# Patient Record
Sex: Male | Born: 1945 | Race: Black or African American | Hispanic: No | Marital: Married
Health system: Southern US, Community
[De-identification: ages and names within clinical notes are randomized; demographics above are authoritative.]

## PROBLEM LIST (undated history)

## (undated) DIAGNOSIS — J9621 Acute and chronic respiratory failure with hypoxia: Secondary | ICD-10-CM

## (undated) DIAGNOSIS — U071 COVID-19: Secondary | ICD-10-CM

## (undated) DIAGNOSIS — I482 Chronic atrial fibrillation, unspecified: Secondary | ICD-10-CM

## (undated) DIAGNOSIS — N183 Chronic kidney disease, stage 3 unspecified: Secondary | ICD-10-CM

## (undated) DIAGNOSIS — J189 Pneumonia, unspecified organism: Secondary | ICD-10-CM

---

## 2020-03-02 ENCOUNTER — Other Ambulatory Visit (HOSPITAL_COMMUNITY): Payer: Medicare Other

## 2020-03-02 ENCOUNTER — Inpatient Hospital Stay
Admission: RE | Admit: 2020-03-02 | Discharge: 2020-05-03 | Disposition: A | Discharge status: Skilled Nursing Facility | Payer: Medicare Other | Source: Other Acute Inpatient Hospital | Attending: Internal Medicine | Admitting: Internal Medicine

## 2020-03-02 ENCOUNTER — Ambulatory Visit (HOSPITAL_COMMUNITY)
Admission: AD | Admit: 2020-03-02 | Discharge: 2020-03-02 | Disposition: A | Discharge status: Home / Self Care | Payer: Medicare Other | Source: Other Acute Inpatient Hospital | Attending: Internal Medicine | Admitting: Internal Medicine

## 2020-03-02 DIAGNOSIS — Z9911 Dependence on respirator [ventilator] status: Secondary | ICD-10-CM | POA: Diagnosis present

## 2020-03-02 DIAGNOSIS — I509 Heart failure, unspecified: Secondary | ICD-10-CM

## 2020-03-02 DIAGNOSIS — T148XXA Other injury of unspecified body region, initial encounter: Secondary | ICD-10-CM

## 2020-03-02 DIAGNOSIS — Z931 Gastrostomy status: Secondary | ICD-10-CM

## 2020-03-02 DIAGNOSIS — Z4659 Encounter for fitting and adjustment of other gastrointestinal appliance and device: Secondary | ICD-10-CM

## 2020-03-02 DIAGNOSIS — I482 Chronic atrial fibrillation, unspecified: Secondary | ICD-10-CM | POA: Diagnosis present

## 2020-03-02 DIAGNOSIS — R0902 Hypoxemia: Secondary | ICD-10-CM

## 2020-03-02 DIAGNOSIS — J811 Chronic pulmonary edema: Secondary | ICD-10-CM

## 2020-03-02 DIAGNOSIS — R52 Pain, unspecified: Secondary | ICD-10-CM

## 2020-03-02 DIAGNOSIS — Z452 Encounter for adjustment and management of vascular access device: Secondary | ICD-10-CM

## 2020-03-02 DIAGNOSIS — U071 COVID-19: Secondary | ICD-10-CM

## 2020-03-02 DIAGNOSIS — J189 Pneumonia, unspecified organism: Secondary | ICD-10-CM

## 2020-03-02 DIAGNOSIS — N183 Chronic kidney disease, stage 3 unspecified: Secondary | ICD-10-CM | POA: Diagnosis present

## 2020-03-02 DIAGNOSIS — Z95828 Presence of other vascular implants and grafts: Secondary | ICD-10-CM

## 2020-03-02 DIAGNOSIS — J9621 Acute and chronic respiratory failure with hypoxia: Secondary | ICD-10-CM | POA: Diagnosis present

## 2020-03-02 DIAGNOSIS — R0603 Acute respiratory distress: Secondary | ICD-10-CM

## 2020-03-02 DIAGNOSIS — J9690 Respiratory failure, unspecified, unspecified whether with hypoxia or hypercapnia: Secondary | ICD-10-CM

## 2020-03-02 DIAGNOSIS — N179 Acute kidney failure, unspecified: Secondary | ICD-10-CM

## 2020-03-02 HISTORY — DX: Pneumonia, unspecified organism: J18.9

## 2020-03-02 HISTORY — DX: COVID-19: U07.1

## 2020-03-02 HISTORY — DX: Chronic kidney disease, stage 3 unspecified: N18.30

## 2020-03-02 HISTORY — DX: Chronic atrial fibrillation, unspecified: I48.20

## 2020-03-02 HISTORY — DX: Acute and chronic respiratory failure with hypoxia: J96.21

## 2020-03-02 LAB — BLOOD GAS, ARTERIAL
Acid-Base Excess: 5.4 mmol/L — ABNORMAL HIGH (ref 0.0–2.0)
Bicarbonate: 30.8 mmol/L — ABNORMAL HIGH (ref 20.0–28.0)
FIO2: 50
O2 Saturation: 98.5 %
Patient temperature: 37
pCO2 arterial: 58.3 mmHg — ABNORMAL HIGH (ref 32.0–48.0)
pH, Arterial: 7.343 — ABNORMAL LOW (ref 7.350–7.450)
pO2, Arterial: 122 mmHg — ABNORMAL HIGH (ref 83.0–108.0)

## 2020-03-02 MED ORDER — IOHEXOL 300 MG/ML  SOLN: 50.0000 mL | Freq: Once | INTRAMUSCULAR | Status: DC | PRN

## 2020-03-03 ENCOUNTER — Encounter: Payer: Self-pay | Admitting: Internal Medicine

## 2020-03-03 DIAGNOSIS — N183 Chronic kidney disease, stage 3 unspecified: Secondary | ICD-10-CM | POA: Diagnosis not present

## 2020-03-03 DIAGNOSIS — I482 Chronic atrial fibrillation, unspecified: Secondary | ICD-10-CM | POA: Diagnosis not present

## 2020-03-03 DIAGNOSIS — U071 COVID-19: Secondary | ICD-10-CM | POA: Diagnosis present

## 2020-03-03 DIAGNOSIS — J189 Pneumonia, unspecified organism: Secondary | ICD-10-CM | POA: Diagnosis present

## 2020-03-03 DIAGNOSIS — J9621 Acute and chronic respiratory failure with hypoxia: Secondary | ICD-10-CM

## 2020-03-03 LAB — CBC WITH DIFFERENTIAL/PLATELET
Abs Immature Granulocytes: 0.04 10*3/uL (ref 0.00–0.07)
Basophils Absolute: 0 10*3/uL (ref 0.0–0.1)
Basophils Relative: 0 %
Eosinophils Absolute: 0.1 10*3/uL (ref 0.0–0.5)
Eosinophils Relative: 2 %
HCT: 30.7 % — ABNORMAL LOW (ref 39.0–52.0)
Hemoglobin: 9 g/dL — ABNORMAL LOW (ref 13.0–17.0)
Immature Granulocytes: 1 %
Lymphocytes Relative: 21 %
Lymphs Abs: 1.5 10*3/uL (ref 0.7–4.0)
MCH: 29.4 pg (ref 26.0–34.0)
MCHC: 29.3 g/dL — ABNORMAL LOW (ref 30.0–36.0)
MCV: 100.3 fL — ABNORMAL HIGH (ref 80.0–100.0)
Monocytes Absolute: 0.6 10*3/uL (ref 0.1–1.0)
Monocytes Relative: 8 %
Neutro Abs: 5 10*3/uL (ref 1.7–7.7)
Neutrophils Relative %: 68 %
Platelets: 364 10*3/uL (ref 150–400)
RBC: 3.06 MIL/uL — ABNORMAL LOW (ref 4.22–5.81)
RDW: 17.4 % — ABNORMAL HIGH (ref 11.5–15.5)
WBC: 7.2 10*3/uL (ref 4.0–10.5)
nRBC: 0.4 % — ABNORMAL HIGH (ref 0.0–0.2)

## 2020-03-03 LAB — COMPREHENSIVE METABOLIC PANEL
ALT: 66 U/L — ABNORMAL HIGH (ref 0–44)
AST: 37 U/L (ref 15–41)
Albumin: 2.5 g/dL — ABNORMAL LOW (ref 3.5–5.0)
Alkaline Phosphatase: 83 U/L (ref 38–126)
Anion gap: 12 (ref 5–15)
BUN: 57 mg/dL — ABNORMAL HIGH (ref 8–23)
CO2: 29 mmol/L (ref 22–32)
Calcium: 9.6 mg/dL (ref 8.9–10.3)
Chloride: 101 mmol/L (ref 98–111)
Creatinine, Ser: 1.67 mg/dL — ABNORMAL HIGH (ref 0.61–1.24)
GFR calc Af Amer: 46 mL/min — ABNORMAL LOW (ref >60)
GFR calc non Af Amer: 40 mL/min — ABNORMAL LOW (ref >60)
Glucose, Bld: 113 mg/dL — ABNORMAL HIGH (ref 70–99)
Potassium: 4.1 mmol/L (ref 3.5–5.1)
Sodium: 142 mmol/L (ref 135–145)
Total Bilirubin: 0.8 mg/dL (ref 0.3–1.2)
Total Protein: 7.8 g/dL (ref 6.5–8.1)

## 2020-03-03 LAB — PROTIME-INR
INR: 1.5 — ABNORMAL HIGH (ref 0.8–1.2)
Prothrombin Time: 17.6 seconds — ABNORMAL HIGH (ref 11.4–15.2)

## 2020-03-03 LAB — MAGNESIUM: Magnesium: 2.4 mg/dL (ref 1.7–2.4)

## 2020-03-03 LAB — APTT: aPTT: 42 seconds — ABNORMAL HIGH (ref 24–36)

## 2020-03-03 LAB — PHOSPHORUS: Phosphorus: 5.3 mg/dL — ABNORMAL HIGH (ref 2.5–4.6)

## 2020-03-03 MED ORDER — QUETIAPINE FUMARATE 25 MG PO TABS
100.00 | ORAL_TABLET | ORAL | Status: DC
Start: 2020-03-02 — End: 2020-03-03

## 2020-03-03 MED ORDER — GENERIC EXTERNAL MEDICATION
Status: DC
Start: ? — End: 2020-03-03

## 2020-03-03 MED ORDER — ACETAMINOPHEN 160 MG/5ML PO SUSP
500.00 | ORAL | Status: DC
Start: ? — End: 2020-03-03

## 2020-03-03 MED ORDER — PNEUMOCOCCAL 13-VAL CONJ VACC IM SUSP
0.50 | INTRAMUSCULAR | Status: DC
Start: ? — End: 2020-03-03

## 2020-03-03 MED ORDER — LABETALOL HCL 200 MG PO TABS
200.00 | ORAL_TABLET | ORAL | Status: DC
Start: 2020-03-02 — End: 2020-03-03

## 2020-03-03 MED ORDER — FUROSEMIDE 10 MG/ML IJ SOLN
60.00 | INTRAMUSCULAR | Status: DC
Start: 2020-03-02 — End: 2020-03-03

## 2020-03-03 MED ORDER — DEXTROMETHORPHAN-GUAIFENESIN 10-100 MG/5ML PO LIQD
10.00 | ORAL | Status: DC
Start: ? — End: 2020-03-03

## 2020-03-03 MED ORDER — POLYETHYLENE GLYCOL 3350 17 GM/SCOOP PO POWD
17.00 | ORAL | Status: DC
Start: 2020-03-03 — End: 2020-03-03

## 2020-03-03 MED ORDER — LABETALOL HCL 5 MG/ML IV SOLN
10.00 | INTRAVENOUS | Status: DC
Start: ? — End: 2020-03-03

## 2020-03-03 MED ORDER — THIAMINE HCL 100 MG PO TABS
100.00 | ORAL_TABLET | ORAL | Status: DC
Start: 2020-03-03 — End: 2020-03-03

## 2020-03-03 MED ORDER — CIPROFLOXACIN HCL 500 MG PO TABS
750.00 | ORAL_TABLET | ORAL | Status: DC
Start: 2020-03-02 — End: 2020-03-03

## 2020-03-03 MED ORDER — SODIUM CHLORIDE 0.9 % IV SOLN
10.00 | INTRAVENOUS | Status: DC
Start: ? — End: 2020-03-03

## 2020-03-03 MED ORDER — CLOTRIMAZOLE-BETAMETHASONE 1-0.05 % EX CREA
TOPICAL_CREAM | CUTANEOUS | Status: DC
Start: 2020-03-02 — End: 2020-03-03

## 2020-03-03 MED ORDER — QUETIAPINE FUMARATE 25 MG PO TABS
25.00 | ORAL_TABLET | ORAL | Status: DC
Start: 2020-03-03 — End: 2020-03-03

## 2020-03-03 MED ORDER — TRAZODONE HCL 50 MG PO TABS
50.00 | ORAL_TABLET | ORAL | Status: DC
Start: 2020-03-02 — End: 2020-03-03

## 2020-03-03 MED ORDER — APIXABAN 5 MG PO TABS
5.00 | ORAL_TABLET | ORAL | Status: DC
Start: 2020-03-02 — End: 2020-03-03

## 2020-03-03 MED ORDER — OXYCODONE HCL 5 MG/5ML PO SOLN
5.00 | ORAL | Status: DC
Start: 2020-03-02 — End: 2020-03-03

## 2020-03-03 MED ORDER — AMIODARONE HCL 200 MG PO TABS
200.00 | ORAL_TABLET | ORAL | Status: DC
Start: ? — End: 2020-03-03

## 2020-03-03 MED ORDER — MORPHINE SULFATE (PF) 2 MG/ML IV SOLN
1.00 | INTRAVENOUS | Status: DC
Start: ? — End: 2020-03-03

## 2020-03-03 MED ORDER — ACETAZOLAMIDE 250 MG PO TABS
250.00 | ORAL_TABLET | ORAL | Status: DC
Start: 2020-03-03 — End: 2020-03-03

## 2020-03-03 MED ORDER — OXYCODONE HCL 5 MG/5ML PO SOLN
5.00 | ORAL | Status: DC
Start: ? — End: 2020-03-03

## 2020-03-03 NOTE — Consult Note (Signed)
[facility] [place]  Date of Service: 03/03/2020  PULMONARY CRITICAL CARE CONSULT   Russell Rios  DVV:[id number]  DOB: May 05, 1946   DOA: 03/02/2020  Referring Physician: Merton Border, MD  HPI: Russell Rios is a 74 y.o. male seen for follow up of Acute on Chronic Respiratory Failure.  Patient has multiple medical problems as indicated below with history chronic kidney disease aortic aneurysm presented to the hospital with increasing shortness of breath irregular heartbeat.  Patient was admitted to the hospital in respiratory failure intubated placed on mechanical ventilation.  Hospital course was complicated patient had worsening renal failure.  Patient was found to be positive for Covid and treated for Covid with standard therapy.  Patient also developed pneumonia and was found to have Pseudomonas and was treated with meropenem for approximately 10 days along with this received amikacin and Cipro according to the infectious disease notes.  Subsequently transferred to our facility for further management and weaning.  Right now is on the ventilator and pressure control mode  Review of Systems:  ROS performed and is unremarkable other than noted above.  Past Medical History:  Diagnosis Date  . AAA (abdominal aortic aneurysm) (*)  AORTIC ABDOMINAL ANEURYSM ENDOVASCULAR REPAIR WITH GORE GRAFT 09/19/17 by Dr. Carmine Savoy  . Abnormal CT of the abdomen 10/16/14  1.5 cm posterior right hepatic lobe lesion and 1.8 centimeter exophytic hyperdensity left kidney.  Marland Kitchen BPH (benign prostatic hypertrophy)  with asymptomatic pyuria seen by Lacinda Axon 9/97  . Calcium oxalate crystals in urine 04/27/2017  . Chest pain  with cardiac cath normal 2/94. Dr Mauricio Po  . CKD (chronic kidney disease), stage II  . Family history of Alzheimer's disease  Brother who is 5. Please see OV 06/08/14  . History of tobacco use  . Hypercholesterolemia  . Hypertension  .  Syncope, tussive  post tussive   Past Surgical History:  Procedure Laterality Date  . Aortic abdominal aneurysm endovascular repair with gore graft 09/19/2017  Dr. Carmine Savoy  . Appendectomy laparoscopic, intra operative ultrasound 10/16/14  Dr Candiss Norse  . Coil embolization lt hypogastric art 09/05/2017  Dr. Orson Aloe  . Knee arthroscopy Left 1992  Dr Sharlyn Bologna   Allergies  Allergen Reactions  . Lipitor [Atorvastatin]  . Lisinopril  angioedema  . Zocor [Simvastatin] Rash     Medications: Reviewed on Rounds  Physical Exam:  Vitals: Temperature is 96.8 pulse 76 respiratory 30 blood pressure is 150/87 saturations 96%  Ventilator Settings mode ventilation pressure assist control FiO2 40% IP 12 PEEP 5  . General: Comfortable at this time . Eyes: Grossly normal lids, irises & conjunctiva . ENT: grossly tongue is normal . Neck: no obvious mass . Cardiovascular: S1-S2 normal no gallop or rub . Respiratory: No rhonchi coarse breath sounds . Abdomen: Soft and nontender . Skin: no rash seen on limited exam . Musculoskeletal: not rigid . Psychiatric:unable to assess . Neurologic: no seizure no involuntary movements         Labs on Admission:  Basic Metabolic Panel: Recent Labs  Lab 03/03/20 0641  NA 142  K 4.1  CL 101  CO2 29  GLUCOSE 113*  BUN 57*  CREATININE 1.67*  CALCIUM 9.6  MG 2.4  PHOS 5.3*    Recent Labs  Lab 03/02/20 2312  PHART 7.343*  PCO2ART 58.3*  PO2ART 122*  HCO3 30.8*  O2SAT 98.5    Liver Function Tests: Recent Labs  Lab 03/03/20 0641  AST 37  ALT  66*  ALKPHOS 83  BILITOT 0.8  PROT 7.8  ALBUMIN 2.5*   No results for input(s): LIPASE, AMYLASE in the last 168 hours. No results for input(s): AMMONIA in the last 168 hours.  CBC: Recent Labs  Lab 03/03/20 0641  WBC 7.2  NEUTROABS 5.0  HGB 9.0*  HCT 30.7*  MCV 100.3*  PLT 364    Cardiac Enzymes: No results for input(s): CKTOTAL, CKMB, CKMBINDEX, TROPONINI in the last 168  hours.  BNP (last 3 results) No results for input(s): BNP in the last 8760 hours.  ProBNP (last 3 results) No results for input(s): PROBNP in the last 8760 hours.   Radiological Exams on Admission: DG ABDOMEN PEG TUBE LOCATION  Result Date: 03/02/2020 CLINICAL DATA:  Check gastrostomy placement EXAM: ABDOMEN - 1 VIEW COMPARISON:  None. FINDINGS: Contrast material is been injected through the indwelling gastrostomy catheter. This flows directly into the stomach and subsequently into the proximal small bowel. No obstructive changes are seen. Embolic changes are noted in the left internal iliac artery with evidence of prior stent graft therapy. IMPRESSION: Gastrostomy catheter within the stomach. Electronically Signed   By: Alcide Clever M.D.   On: 03/02/2020 23:07   DG Chest Port 1 View  Result Date: 03/02/2020 CLINICAL DATA:  Increased secretions EXAM: PORTABLE CHEST 1 VIEW COMPARISON:  None. FINDINGS: Tracheostomy tube is noted in satisfactory position. Cardiac shadow is within normal limits. Aortic calcifications are seen. Diffuse interstitial opacities are noted throughout both lungs likely of a chronic nature although acute on chronic infiltrates cannot be excluded given the lack of a previous film. No bony abnormality is noted. IMPRESSION: Diffuse interstitial opacities likely chronic in nature although an acute on chronic component cannot be totally excluded. Electronically Signed   By: Alcide Clever M.D.   On: 03/02/2020 23:08    Assessment/Plan Active Problems:   Acute on chronic respiratory failure with hypoxia (HCC)   COVID-19 virus infection   Chronic atrial fibrillation with rapid ventricular response (HCC)   Chronic kidney disease, stage III (moderate)   Healthcare-associated pneumonia   1. Acute on chronic respiratory failure with hypoxia patient currently is on pressure assist control mode has been on 40% FiO2 currently on IPF 12 PEEP of 5.  Respiratory therapy will assess the  RSB I mechanics and try to advance on weaning as tolerated. 2. COVID-19 virus infection resolved we will continue with supportive care 3. Chronic atrial fibrillation with a rapid ventricular response rate now is rate controlled we will continue with medical management. 4. Chronic kidney disease stage III monitoring labs closely monitoring fluid status. 5. Healthcare associated pneumonia with Pseudomonas patient apparently had multidrug-resistant organism.  We will need to monitor for recurrence.  The most recent chest x-ray shows diffuse interstitial opacities likely related to the COVID-19.  I have personally seen and evaluated the patient, evaluated laboratory and imaging results, formulated the assessment and plan and placed orders. The Patient requires high complexity decision making with multiple systems involvement.  Case was discussed on Rounds with the Respiratory Therapy Director and the Respiratory staff Time Spent  Yevonne Pax, MD [facility] Pulmonary Critical Care Medicine Sleep Medicine

## 2020-03-04 DIAGNOSIS — J9621 Acute and chronic respiratory failure with hypoxia: Secondary | ICD-10-CM | POA: Diagnosis not present

## 2020-03-04 DIAGNOSIS — I482 Chronic atrial fibrillation, unspecified: Secondary | ICD-10-CM | POA: Diagnosis not present

## 2020-03-04 DIAGNOSIS — N183 Chronic kidney disease, stage 3 unspecified: Secondary | ICD-10-CM | POA: Diagnosis not present

## 2020-03-04 DIAGNOSIS — U071 COVID-19: Secondary | ICD-10-CM | POA: Diagnosis not present

## 2020-03-04 MED ORDER — GENERIC EXTERNAL MEDICATION
Status: DC
Start: ? — End: 2020-03-04

## 2020-03-04 NOTE — Progress Notes (Signed)
Pulmonary Critical Care Medicine [place]   PULMONARY CRITICAL CARE SERVICE  PROGRESS NOTE  Date of Service: 03/04/2020  Russell Rios  WUJ:[id number]  DOB: 01-Oct-1946   DOA: 03/02/2020  Referring Physician: Merton Border, MD  HPI: Russell Rios is a 74 y.o. male seen for follow up of Acute on Chronic Respiratory Failure.  Patient currently is on pressure support mode has been on 35% FiO2 currently is on 12/5 with a goal of 8 to 12 hours  Medications: Reviewed on Rounds  Physical Exam:  Vitals: Temperature is 97.6 pulse 74 respiratory 26 blood pressure is 135/65 saturations 94%  Ventilator Settings on pressure support FiO2 35% pressure poor 12 PEEP five  . General: Comfortable at this time . Eyes: Grossly normal lids, irises & conjunctiva . ENT: grossly tongue is normal . Neck: no obvious mass . Cardiovascular: S1 S2 normal no gallop . Respiratory: No rhonchi coarse breath sounds . Abdomen: soft . Skin: no rash seen on limited exam . Musculoskeletal: not rigid . Psychiatric:unable to assess . Neurologic: no seizure no involuntary movements         Lab Data:   Basic Metabolic Panel: Recent Labs  Lab 03/03/20 0641  NA 142  K 4.1  CL 101  CO2 29  GLUCOSE 113*  BUN 57*  CREATININE 1.67*  CALCIUM 9.6  MG 2.4  PHOS 5.3*    ABG: Recent Labs  Lab 03/02/20 2312  PHART 7.343*  PCO2ART 58.3*  PO2ART 122*  HCO3 30.8*  O2SAT 98.5    Liver Function Tests: Recent Labs  Lab 03/03/20 0641  AST 37  ALT 66*  ALKPHOS 83  BILITOT 0.8  PROT 7.8  ALBUMIN 2.5*   No results for input(s): LIPASE, AMYLASE in the last 168 hours. No results for input(s): AMMONIA in the last 168 hours.  CBC: Recent Labs  Lab 03/03/20 0641  WBC 7.2  NEUTROABS 5.0  HGB 9.0*  HCT 30.7*  MCV 100.3*  PLT 364    Cardiac Enzymes: No results for input(s): CKTOTAL, CKMB, CKMBINDEX, TROPONINI in the last 168 hours.  BNP (last 3 results) No results for input(s):  BNP in the last 8760 hours.  ProBNP (last 3 results) No results for input(s): PROBNP in the last 8760 hours.  Radiological Exams: DG ABDOMEN PEG TUBE LOCATION  Result Date: 03/02/2020 CLINICAL DATA:  Check gastrostomy placement EXAM: ABDOMEN - 1 VIEW COMPARISON:  None. FINDINGS: Contrast material is been injected through the indwelling gastrostomy catheter. This flows directly into the stomach and subsequently into the proximal small bowel. No obstructive changes are seen. Embolic changes are noted in the left internal iliac artery with evidence of prior stent graft therapy. IMPRESSION: Gastrostomy catheter within the stomach. Electronically Signed   By: Inez Catalina M.D.   On: 03/02/2020 23:07   DG Chest Port 1 View  Result Date: 03/02/2020 CLINICAL DATA:  Increased secretions EXAM: PORTABLE CHEST 1 VIEW COMPARISON:  None. FINDINGS: Tracheostomy tube is noted in satisfactory position. Cardiac shadow is within normal limits. Aortic calcifications are seen. Diffuse interstitial opacities are noted throughout both lungs likely of a chronic nature although acute on chronic infiltrates cannot be excluded given the lack of a previous film. No bony abnormality is noted. IMPRESSION: Diffuse interstitial opacities likely chronic in nature although an acute on chronic component cannot be totally excluded. Electronically Signed   By: Inez Catalina M.D.   On: 03/02/2020 23:08    Assessment/Plan Active Problems:   Acute on chronic  respiratory failure with hypoxia (HCC)   COVID-19 virus infection   Chronic atrial fibrillation with rapid ventricular response (HCC)   Chronic kidney disease, stage III (moderate)   Healthcare-associated pneumonia   1. Acute on chronic respiratory failure with hypoxia plan is to continue with pressure support wean as tolerated. 2. COVID-19 virus infection treated we will continue with supportive care 3. Chronic atrial fibrillation rate controlled 4. Chronic kidney disease  stage III following labs 5. Healthcare associated pneumonia treated we will continue to monitor radiologically   I have personally seen and evaluated the patient, evaluated laboratory and imaging results, formulated the assessment and plan and placed orders. The Patient requires high complexity decision making with multiple systems involvement.  Rounds were done with the Respiratory Therapy Director and Staff therapists and discussed with nursing staff also.  Yevonne Pax, MD [facility] Pulmonary Critical Care Medicine Sleep Medicine

## 2020-03-05 DIAGNOSIS — N183 Chronic kidney disease, stage 3 unspecified: Secondary | ICD-10-CM | POA: Diagnosis not present

## 2020-03-05 DIAGNOSIS — I482 Chronic atrial fibrillation, unspecified: Secondary | ICD-10-CM | POA: Diagnosis not present

## 2020-03-05 DIAGNOSIS — J9621 Acute and chronic respiratory failure with hypoxia: Secondary | ICD-10-CM | POA: Diagnosis not present

## 2020-03-05 DIAGNOSIS — U071 COVID-19: Secondary | ICD-10-CM | POA: Diagnosis not present

## 2020-03-05 NOTE — Progress Notes (Addendum)
Pulmonary Critical Care Medicine [facility] [place]   PULMONARY CRITICAL CARE SERVICE  PROGRESS NOTE  Date of Service: 03/05/2020  Russell Rios  WUJ:[id number]  DOB: December 14, 1945   DOA: 03/02/2020  Referring Physician: Carron Curie, MD  HPI: Russell Rios is a 74 y.o. male seen for follow up of Acute on Chronic Respiratory Failure.  Patient failed wean and remains on pressure control at this time with a current rate of 22 and FiO2 of 35% satting well with no distress.  Medications: Reviewed on Rounds  Physical Exam:  Vitals: Pulse 82 respirations 32 BP 119/62 O2 sat 99% temp 98.1  Ventilator Settings ventilator mode AC PC rate of 22 tidal volume 20 PEEP of 5 and FiO2 of 35%  . General: Comfortable at this time . Eyes: Grossly normal lids, irises & conjunctiva . ENT: grossly tongue is normal . Neck: no obvious mass . Cardiovascular: S1 S2 normal no gallop . Respiratory: No rales or rhonchi noted . Abdomen: soft . Skin: no rash seen on limited exam . Musculoskeletal: not rigid . Psychiatric:unable to assess . Neurologic: no seizure no involuntary movements         Lab Data:   Basic Metabolic Panel: Recent Labs  Lab 03/03/20 0641  NA 142  K 4.1  CL 101  CO2 29  GLUCOSE 113*  BUN 57*  CREATININE 1.67*  CALCIUM 9.6  MG 2.4  PHOS 5.3*    ABG: Recent Labs  Lab 03/02/20 2312  PHART 7.343*  PCO2ART 58.3*  PO2ART 122*  HCO3 30.8*  O2SAT 98.5    Liver Function Tests: Recent Labs  Lab 03/03/20 0641  AST 37  ALT 66*  ALKPHOS 83  BILITOT 0.8  PROT 7.8  ALBUMIN 2.5*   No results for input(s): LIPASE, AMYLASE in the last 168 hours. No results for input(s): AMMONIA in the last 168 hours.  CBC: Recent Labs  Lab 03/03/20 0641  WBC 7.2  NEUTROABS 5.0  HGB 9.0*  HCT 30.7*  MCV 100.3*  PLT 364    Cardiac Enzymes: No results for input(s): CKTOTAL, CKMB, CKMBINDEX, TROPONINI in the last 168 hours.  BNP (last 3 results) No results for  input(s): BNP in the last 8760 hours.  ProBNP (last 3 results) No results for input(s): PROBNP in the last 8760 hours.  Radiological Exams: No results found.  Assessment/Plan Active Problems:   Acute on chronic respiratory failure with hypoxia (HCC)   COVID-19 virus infection   Chronic atrial fibrillation with rapid ventricular response (HCC)   Chronic kidney disease, stage III (moderate)   Healthcare-associated pneumonia   1. Acute on chronic respiratory failure with hypoxia plan is to continue with pressure support wean as tolerated. 2. COVID-19 virus infection treated we will continue with supportive care 3. Chronic atrial fibrillation rate controlled 4. Chronic kidney disease stage III following labs 5. Healthcare associated pneumonia treated we will continue to monitor radiologically   I have personally seen and evaluated the patient, evaluated laboratory and imaging results, formulated the assessment and plan and placed orders. The Patient requires high complexity decision making with multiple systems involvement.  Rounds were done with the Respiratory Therapy Director and Staff therapists and discussed with nursing staff also.  Yevonne Pax, MD [facility] Pulmonary Critical Care Medicine Sleep Medicine

## 2020-03-06 DIAGNOSIS — N183 Chronic kidney disease, stage 3 unspecified: Secondary | ICD-10-CM | POA: Diagnosis not present

## 2020-03-06 DIAGNOSIS — J9621 Acute and chronic respiratory failure with hypoxia: Secondary | ICD-10-CM | POA: Diagnosis not present

## 2020-03-06 DIAGNOSIS — I482 Chronic atrial fibrillation, unspecified: Secondary | ICD-10-CM | POA: Diagnosis not present

## 2020-03-06 DIAGNOSIS — U071 COVID-19: Secondary | ICD-10-CM | POA: Diagnosis not present

## 2020-03-06 MED ORDER — GENERIC EXTERNAL MEDICATION
Status: DC
Start: ? — End: 2020-03-06

## 2020-03-06 NOTE — Progress Notes (Signed)
Pulmonary Critical Care Medicine [facility] [place]   PULMONARY CRITICAL CARE SERVICE  PROGRESS NOTE  Date of Service: 03/06/2020  Russell Rios  [id number]  DOB: February 20, 1946   DOA: 03/02/2020  Referring Physician: Carron Curie, MD  HPI: Russell Rios is a 74 y.o. male seen for follow up of Acute on Chronic Respiratory Failure.  Patient currently is on the ventilator and full support will go ahead and change the trach out to an XLT cuffed trach and then try for pressure support wean  Medications: Reviewed on Rounds  Physical Exam:  Vitals: Temperature is 96.9 pulse 78 respiratory rate 20 blood pressure is 118/59 saturations 95%  Ventilator Settings on pressure assist control FiO2 35% tidal volume is 551 PEEP 5  . General: Comfortable at this time . Eyes: Grossly normal lids, irises & conjunctiva . ENT: grossly tongue is normal . Neck: no obvious mass . Cardiovascular: S1 S2 normal no gallop . Respiratory: No rhonchi no rales noted at this time . Abdomen: soft . Skin: no rash seen on limited exam . Musculoskeletal: not rigid . Psychiatric:unable to assess . Neurologic: no seizure no involuntary movements         Lab Data:   Basic Metabolic Panel: Recent Labs  Lab 03/03/20 0641  NA 142  K 4.1  CL 101  CO2 29  GLUCOSE 113*  BUN 57*  CREATININE 1.67*  CALCIUM 9.6  MG 2.4  PHOS 5.3*    ABG: Recent Labs  Lab 03/02/20 2312  PHART 7.343*  PCO2ART 58.3*  PO2ART 122*  HCO3 30.8*  O2SAT 98.5    Liver Function Tests: Recent Labs  Lab 03/03/20 0641  AST 37  ALT 66*  ALKPHOS 83  BILITOT 0.8  PROT 7.8  ALBUMIN 2.5*   No results for input(s): LIPASE, AMYLASE in the last 168 hours. No results for input(s): AMMONIA in the last 168 hours.  CBC: Recent Labs  Lab 03/03/20 0641  WBC 7.2  NEUTROABS 5.0  HGB 9.0*  HCT 30.7*  MCV 100.3*  PLT 364    Cardiac Enzymes: No results for input(s): CKTOTAL, CKMB, CKMBINDEX, TROPONINI in the last  168 hours.  BNP (last 3 results) No results for input(s): BNP in the last 8760 hours.  ProBNP (last 3 results) No results for input(s): PROBNP in the last 8760 hours.  Radiological Exams: No results found.  Assessment/Plan Active Problems:   Acute on chronic respiratory failure with hypoxia (HCC)   COVID-19 virus infection   Chronic atrial fibrillation with rapid ventricular response (HCC)   Chronic kidney disease, stage III (moderate)   Healthcare-associated pneumonia   1. Acute on chronic respiratory failure with hypoxia plan is going to be to continue with weaning once the tracheostomy is changed over to a #6 XLT 2. COVID-19 virus infection resolved we will continue with supportive care 3. Chronic atrial fibrillation rate is controlled 4. Chronic kidney disease stage III at baseline we will continue to follow 5. Healthcare associated pneumonia treated we will continue present management   I have personally seen and evaluated the patient, evaluated laboratory and imaging results, formulated the assessment and plan and placed orders. The Patient requires high complexity decision making with multiple systems involvement.  Rounds were done with the Respiratory Therapy Director and Staff therapists and discussed with nursing staff also.  Yevonne Pax, MD [facility] Pulmonary Critical Care Medicine Sleep Medicine

## 2020-03-07 DIAGNOSIS — I482 Chronic atrial fibrillation, unspecified: Secondary | ICD-10-CM | POA: Diagnosis not present

## 2020-03-07 DIAGNOSIS — N183 Chronic kidney disease, stage 3 unspecified: Secondary | ICD-10-CM | POA: Diagnosis not present

## 2020-03-07 DIAGNOSIS — J9621 Acute and chronic respiratory failure with hypoxia: Secondary | ICD-10-CM | POA: Diagnosis not present

## 2020-03-07 DIAGNOSIS — U071 COVID-19: Secondary | ICD-10-CM | POA: Diagnosis not present

## 2020-03-07 NOTE — Progress Notes (Signed)
Pulmonary Critical Care Medicine [facility] [place]   PULMONARY CRITICAL CARE SERVICE  PROGRESS NOTE  Date of Service: 03/07/2020  Russell Rios  YTK:[id number]  DOB: 1946-10-19   DOA: 03/02/2020  Referring Physician: Carron Curie, MD  HPI: Russell Rios is a 74 y.o. male seen for follow up of Acute on Chronic Respiratory Failure.  Patient currently is on pressure support has been on 35% FiO2 ventilation does appear to be improving slowly and we are going to try to continue to advance the weaning  Medications: Reviewed on Rounds  Physical Exam:  Vitals: Temperature 97.3 pulse 78 respiratory 22 blood pressure is 101/56 saturations 96%  Ventilator Settings on pressure support FiO2 35% tidal volume 471 per support 12/5  . General: Comfortable at this time . Eyes: Grossly normal lids, irises & conjunctiva . ENT: grossly tongue is normal . Neck: no obvious mass . Cardiovascular: S1 S2 normal no gallop . Respiratory: No rhonchi coarse breath sounds are noted . Abdomen: soft . Skin: no rash seen on limited exam . Musculoskeletal: not rigid . Psychiatric:unable to assess . Neurologic: no seizure no involuntary movements         Lab Data:   Basic Metabolic Panel: Recent Labs  Lab 03/03/20 0641  NA 142  K 4.1  CL 101  CO2 29  GLUCOSE 113*  BUN 57*  CREATININE 1.67*  CALCIUM 9.6  MG 2.4  PHOS 5.3*    ABG: Recent Labs  Lab 03/02/20 2312  PHART 7.343*  PCO2ART 58.3*  PO2ART 122*  HCO3 30.8*  O2SAT 98.5    Liver Function Tests: Recent Labs  Lab 03/03/20 0641  AST 37  ALT 66*  ALKPHOS 83  BILITOT 0.8  PROT 7.8  ALBUMIN 2.5*   No results for input(s): LIPASE, AMYLASE in the last 168 hours. No results for input(s): AMMONIA in the last 168 hours.  CBC: Recent Labs  Lab 03/03/20 0641  WBC 7.2  NEUTROABS 5.0  HGB 9.0*  HCT 30.7*  MCV 100.3*  PLT 364    Cardiac Enzymes: No results for input(s): CKTOTAL, CKMB, CKMBINDEX, TROPONINI in the  last 168 hours.  BNP (last 3 results) No results for input(s): BNP in the last 8760 hours.  ProBNP (last 3 results) No results for input(s): PROBNP in the last 8760 hours.  Radiological Exams: No results found.  Assessment/Plan Active Problems:   Acute on chronic respiratory failure with hypoxia (HCC)   COVID-19 virus infection   Chronic atrial fibrillation with rapid ventricular response (HCC)   Chronic kidney disease, stage III (moderate)   Healthcare-associated pneumonia   1. Acute on chronic respiratory failure with hypoxia we will continue with pressure support wean titrate oxygen on 35% FiO2 2. COVID-19 virus infection in resolution phase we will continue with supportive care 3. Chronic atrial fibrillation rate controlled 4. Chronic kidney disease stage III we will continue to monitor 5. Healthcare associated pneumonia treated we will continue with supportive care   I have personally seen and evaluated the patient, evaluated laboratory and imaging results, formulated the assessment and plan and placed orders. The Patient requires high complexity decision making with multiple systems involvement.  Rounds were done with the Respiratory Therapy Director and Staff therapists and discussed with nursing staff also.  Yevonne Pax, MD [facility] Pulmonary Critical Care Medicine Sleep Medicine

## 2020-03-08 ENCOUNTER — Other Ambulatory Visit (HOSPITAL_COMMUNITY): Payer: Medicare Other

## 2020-03-08 DIAGNOSIS — N183 Chronic kidney disease, stage 3 unspecified: Secondary | ICD-10-CM | POA: Diagnosis not present

## 2020-03-08 DIAGNOSIS — I482 Chronic atrial fibrillation, unspecified: Secondary | ICD-10-CM | POA: Diagnosis not present

## 2020-03-08 DIAGNOSIS — U071 COVID-19: Secondary | ICD-10-CM | POA: Diagnosis not present

## 2020-03-08 DIAGNOSIS — J9621 Acute and chronic respiratory failure with hypoxia: Secondary | ICD-10-CM | POA: Diagnosis not present

## 2020-03-08 LAB — BASIC METABOLIC PANEL
Anion gap: 16 — ABNORMAL HIGH (ref 5–15)
BUN: 129 mg/dL — ABNORMAL HIGH (ref 8–23)
CO2: 27 mmol/L (ref 22–32)
Calcium: 9.9 mg/dL (ref 8.9–10.3)
Chloride: 105 mmol/L (ref 98–111)
Creatinine, Ser: 2.49 mg/dL — ABNORMAL HIGH (ref 0.61–1.24)
GFR calc Af Amer: 28 mL/min — ABNORMAL LOW (ref >60)
GFR calc non Af Amer: 24 mL/min — ABNORMAL LOW (ref >60)
Glucose, Bld: 139 mg/dL — ABNORMAL HIGH (ref 70–99)
Potassium: 2.7 mmol/L — CL (ref 3.5–5.1)
Sodium: 148 mmol/L — ABNORMAL HIGH (ref 135–145)

## 2020-03-08 LAB — CBC
HCT: 27.7 % — ABNORMAL LOW (ref 39.0–52.0)
HCT: 25.9 % — ABNORMAL LOW (ref 39.0–52.0)
Hemoglobin: 8 g/dL — ABNORMAL LOW (ref 13.0–17.0)
Hemoglobin: 7.5 g/dL — ABNORMAL LOW (ref 13.0–17.0)
MCH: 29.4 pg (ref 26.0–34.0)
MCH: 29.3 pg (ref 26.0–34.0)
MCHC: 28.9 g/dL — ABNORMAL LOW (ref 30.0–36.0)
MCHC: 29 g/dL — ABNORMAL LOW (ref 30.0–36.0)
MCV: 101.8 fL — ABNORMAL HIGH (ref 80.0–100.0)
MCV: 101.2 fL — ABNORMAL HIGH (ref 80.0–100.0)
Platelets: 378 10*3/uL (ref 150–400)
Platelets: 338 10*3/uL (ref 150–400)
RBC: 2.72 MIL/uL — ABNORMAL LOW (ref 4.22–5.81)
RBC: 2.56 MIL/uL — ABNORMAL LOW (ref 4.22–5.81)
RDW: 18.7 % — ABNORMAL HIGH (ref 11.5–15.5)
RDW: 19.1 % — ABNORMAL HIGH (ref 11.5–15.5)
WBC: 9.4 10*3/uL (ref 4.0–10.5)
WBC: 8.6 10*3/uL (ref 4.0–10.5)
nRBC: 1.2 % — ABNORMAL HIGH (ref 0.0–0.2)
nRBC: 1.2 % — ABNORMAL HIGH (ref 0.0–0.2)

## 2020-03-08 LAB — OCCULT BLOOD X 1 CARD TO LAB, STOOL: Fecal Occult Bld: POSITIVE — AB

## 2020-03-08 LAB — MAGNESIUM: Magnesium: 2.9 mg/dL — ABNORMAL HIGH (ref 1.7–2.4)

## 2020-03-08 LAB — POTASSIUM: Potassium: 3.7 mmol/L (ref 3.5–5.1)

## 2020-03-08 MED ORDER — GENERIC EXTERNAL MEDICATION
Status: DC
Start: ? — End: 2020-03-08

## 2020-03-08 NOTE — Progress Notes (Signed)
Pulmonary Critical Care Medicine [facility] [place]   PULMONARY CRITICAL CARE SERVICE  PROGRESS NOTE  Date of Service: 03/08/2020  Russell Rios  JQZ:[id number]  DOB: May 19, 1946   DOA: 03/02/2020  Referring Physician: Carron Curie, MD  HPI: Russell Rios is a 74 y.o. male seen for follow up of Acute on Chronic Respiratory Failure.  Patient currently is on pressure support mode has been on 35% FiO2 and the goal is about 16 hours today  Medications: Reviewed on Rounds  Physical Exam:  Vitals: Temperature is 97.6 pulse 82 respiratory 30 blood pressure is 151/58 saturations 98%  Ventilator Settings on pressure support FiO2 is 35% pressure support 12/5  . General: Comfortable at this time . Eyes: Grossly normal lids, irises & conjunctiva . ENT: grossly tongue is normal . Neck: no obvious mass . Cardiovascular: S1 S2 normal no gallop . Respiratory: Coarse rhonchi expansion is equal at this time . Abdomen: soft . Skin: no rash seen on limited exam . Musculoskeletal: not rigid . Psychiatric:unable to assess . Neurologic: no seizure no involuntary movements         Lab Data:   Basic Metabolic Panel: Recent Labs  Lab 03/03/20 0641 03/08/20 0708  NA 142 148*  K 4.1 2.7*  CL 101 105  CO2 29 27  GLUCOSE 113* 139*  BUN 57* 129*  CREATININE 1.67* 2.49*  CALCIUM 9.6 9.9  MG 2.4 2.9*  PHOS 5.3*  --     ABG: Recent Labs  Lab 03/02/20 2312  PHART 7.343*  PCO2ART 58.3*  PO2ART 122*  HCO3 30.8*  O2SAT 98.5    Liver Function Tests: Recent Labs  Lab 03/03/20 0641  AST 37  ALT 66*  ALKPHOS 83  BILITOT 0.8  PROT 7.8  ALBUMIN 2.5*   No results for input(s): LIPASE, AMYLASE in the last 168 hours. No results for input(s): AMMONIA in the last 168 hours.  CBC: Recent Labs  Lab 03/03/20 0641 03/08/20 0708  WBC 7.2 9.4  NEUTROABS 5.0  --   HGB 9.0* 8.0*  HCT 30.7* 27.7*  MCV 100.3* 101.8*  PLT 364 378    Cardiac Enzymes: No results for input(s):  CKTOTAL, CKMB, CKMBINDEX, TROPONINI in the last 168 hours.  BNP (last 3 results) No results for input(s): BNP in the last 8760 hours.  ProBNP (last 3 results) No results for input(s): PROBNP in the last 8760 hours.  Radiological Exams: No results found.  Assessment/Plan Active Problems:   Acute on chronic respiratory failure with hypoxia (HCC)   COVID-19 virus infection   Chronic atrial fibrillation with rapid ventricular response (HCC)   Chronic kidney disease, stage III (moderate)   Healthcare-associated pneumonia   1. Acute on chronic respiratory failure hypoxia plan is to continue with pressure support FiO2 35% goal of 16 hours 2. COVID-19 virus infection resolved we will continue to follow 3. Chronic atrial fibrillation rate is controlled 4. Chronic kidney disease stage III we will continue to follow 5. Healthcare associated pneumonia treated we will continue supportive care   I have personally seen and evaluated the patient, evaluated laboratory and imaging results, formulated the assessment and plan and placed orders. The Patient requires high complexity decision making with multiple systems involvement.  Rounds were done with the Respiratory Therapy Director and Staff therapists and discussed with nursing staff also.  Yevonne Pax, MD [facility] Pulmonary Critical Care Medicine Sleep Medicine

## 2020-03-09 ENCOUNTER — Other Ambulatory Visit (HOSPITAL_COMMUNITY): Payer: Medicare Other

## 2020-03-09 DIAGNOSIS — I482 Chronic atrial fibrillation, unspecified: Secondary | ICD-10-CM | POA: Diagnosis not present

## 2020-03-09 DIAGNOSIS — N183 Chronic kidney disease, stage 3 unspecified: Secondary | ICD-10-CM | POA: Diagnosis not present

## 2020-03-09 DIAGNOSIS — U071 COVID-19: Secondary | ICD-10-CM | POA: Diagnosis not present

## 2020-03-09 DIAGNOSIS — J9621 Acute and chronic respiratory failure with hypoxia: Secondary | ICD-10-CM | POA: Diagnosis not present

## 2020-03-09 LAB — BASIC METABOLIC PANEL
Anion gap: 17 — ABNORMAL HIGH (ref 5–15)
Anion gap: 11 (ref 5–15)
BUN: 148 mg/dL — ABNORMAL HIGH (ref 8–23)
BUN: 146 mg/dL — ABNORMAL HIGH (ref 8–23)
CO2: 24 mmol/L (ref 22–32)
CO2: 29 mmol/L (ref 22–32)
Calcium: 9.6 mg/dL (ref 8.9–10.3)
Calcium: 9.8 mg/dL (ref 8.9–10.3)
Chloride: 108 mmol/L (ref 98–111)
Chloride: 109 mmol/L (ref 98–111)
Creatinine, Ser: 2.83 mg/dL — ABNORMAL HIGH (ref 0.61–1.24)
Creatinine, Ser: 2.75 mg/dL — ABNORMAL HIGH (ref 0.61–1.24)
GFR calc Af Amer: 24 mL/min — ABNORMAL LOW (ref >60)
GFR calc Af Amer: 25 mL/min — ABNORMAL LOW (ref >60)
GFR calc non Af Amer: 21 mL/min — ABNORMAL LOW (ref >60)
GFR calc non Af Amer: 22 mL/min — ABNORMAL LOW (ref >60)
Glucose, Bld: 126 mg/dL — ABNORMAL HIGH (ref 70–99)
Glucose, Bld: 143 mg/dL — ABNORMAL HIGH (ref 70–99)
Potassium: 3.4 mmol/L — ABNORMAL LOW (ref 3.5–5.1)
Potassium: 3.7 mmol/L (ref 3.5–5.1)
Sodium: 149 mmol/L — ABNORMAL HIGH (ref 135–145)
Sodium: 149 mmol/L — ABNORMAL HIGH (ref 135–145)

## 2020-03-09 LAB — CBC
HCT: 27.8 % — ABNORMAL LOW (ref 39.0–52.0)
HCT: 25.5 % — ABNORMAL LOW (ref 39.0–52.0)
HCT: 25.1 % — ABNORMAL LOW (ref 39.0–52.0)
HCT: 25.2 % — ABNORMAL LOW (ref 39.0–52.0)
Hemoglobin: 7.9 g/dL — ABNORMAL LOW (ref 13.0–17.0)
Hemoglobin: 7.5 g/dL — ABNORMAL LOW (ref 13.0–17.0)
Hemoglobin: 7.3 g/dL — ABNORMAL LOW (ref 13.0–17.0)
Hemoglobin: 7.3 g/dL — ABNORMAL LOW (ref 13.0–17.0)
MCH: 30.2 pg (ref 26.0–34.0)
MCH: 30.7 pg (ref 26.0–34.0)
MCH: 30.7 pg (ref 26.0–34.0)
MCH: 30.3 pg (ref 26.0–34.0)
MCHC: 28.4 g/dL — ABNORMAL LOW (ref 30.0–36.0)
MCHC: 29.4 g/dL — ABNORMAL LOW (ref 30.0–36.0)
MCHC: 29.1 g/dL — ABNORMAL LOW (ref 30.0–36.0)
MCHC: 29 g/dL — ABNORMAL LOW (ref 30.0–36.0)
MCV: 106.1 fL — ABNORMAL HIGH (ref 80.0–100.0)
MCV: 104.5 fL — ABNORMAL HIGH (ref 80.0–100.0)
MCV: 105.5 fL — ABNORMAL HIGH (ref 80.0–100.0)
MCV: 104.6 fL — ABNORMAL HIGH (ref 80.0–100.0)
Platelets: 318 10*3/uL (ref 150–400)
Platelets: 344 10*3/uL (ref 150–400)
Platelets: 342 10*3/uL (ref 150–400)
Platelets: 364 10*3/uL (ref 150–400)
RBC: 2.62 MIL/uL — ABNORMAL LOW (ref 4.22–5.81)
RBC: 2.44 MIL/uL — ABNORMAL LOW (ref 4.22–5.81)
RBC: 2.38 MIL/uL — ABNORMAL LOW (ref 4.22–5.81)
RBC: 2.41 MIL/uL — ABNORMAL LOW (ref 4.22–5.81)
RDW: 19.3 % — ABNORMAL HIGH (ref 11.5–15.5)
RDW: 19.5 % — ABNORMAL HIGH (ref 11.5–15.5)
RDW: 19.7 % — ABNORMAL HIGH (ref 11.5–15.5)
RDW: 19.8 % — ABNORMAL HIGH (ref 11.5–15.5)
WBC: 10.3 10*3/uL (ref 4.0–10.5)
WBC: 11.6 10*3/uL — ABNORMAL HIGH (ref 4.0–10.5)
WBC: 9 10*3/uL (ref 4.0–10.5)
WBC: 9.3 10*3/uL (ref 4.0–10.5)
nRBC: 3 % — ABNORMAL HIGH (ref 0.0–0.2)
nRBC: 2.6 % — ABNORMAL HIGH (ref 0.0–0.2)
nRBC: 2.8 % — ABNORMAL HIGH (ref 0.0–0.2)
nRBC: 2.3 % — ABNORMAL HIGH (ref 0.0–0.2)

## 2020-03-09 LAB — BLOOD GAS, ARTERIAL
Acid-Base Excess: 3.7 mmol/L — ABNORMAL HIGH (ref 0.0–2.0)
Bicarbonate: 29.1 mmol/L — ABNORMAL HIGH (ref 20.0–28.0)
FIO2: 35
O2 Saturation: 82.2 %
Patient temperature: 36.8
pCO2 arterial: 54.6 mmHg — ABNORMAL HIGH (ref 32.0–48.0)
pH, Arterial: 7.344 — ABNORMAL LOW (ref 7.350–7.450)
pO2, Arterial: 52.8 mmHg — ABNORMAL LOW (ref 83.0–108.0)

## 2020-03-09 LAB — TROPONIN I (HIGH SENSITIVITY)
Troponin I (High Sensitivity): 44 ng/L — ABNORMAL HIGH (ref <18)
Troponin I (High Sensitivity): 107 ng/L (ref <18)

## 2020-03-09 LAB — PATHOLOGIST SMEAR REVIEW

## 2020-03-09 MED ORDER — GENERIC EXTERNAL MEDICATION
Status: DC
Start: ? — End: 2020-03-09

## 2020-03-09 NOTE — Consult Note (Signed)
Referring Physician: Manson PasseyBrown, MD  Russell Rios is an 74 y.o. male.                       Chief Complaint: Abnormal HS troponin I  HPI: 74 years old male is admitted with acute on chronic respiratory failure, CKD IV, Aortic aneurysm, COVID pneumonia, Pseudomonas pneumonia. He has been weaned down to NAG on 4 L. He had episodes of severe bradycardia and his troponin I is elevated from 44 to 107.  He is DNR status.  Past Medical History:  Diagnosis Date  . Acute on chronic respiratory failure with hypoxia (HCC)   . Chronic atrial fibrillation with rapid ventricular response (HCC)   . Chronic kidney disease, stage III (moderate)   . COVID-19 virus infection   . Healthcare-associated pneumonia     Past Medical History:  Diagnosis Date  . AAA (abdominal aortic aneurysm) (*)  AORTIC ABDOMINAL ANEURYSM ENDOVASCULAR REPAIR WITH GORE GRAFT 09/19/17 by Dr. Florentina Addisoneonanan  . Abnormal CT of the abdomen 10/16/14  1.5 cm posterior right hepatic lobe lesion and 1.8 centimeter exophytic hyperdensity left kidney.  Marland Kitchen. BPH (benign prostatic hypertrophy)  with asymptomatic pyuria seen by Adriana Simasook 9/97  . Calcium oxalate crystals in urine 04/27/2017  . Chest pain  with cardiac cath normal 2/94. Dr Leeann Mustenaldo  . CKD (chronic kidney disease), stage II  . Family history of Alzheimer's disease  Brother who is 5471. Please see OV 06/08/14  . History of tobacco use  . Hypercholesterolemia  . Hypertension  . Syncope, tussive  post tussive   Past Surgical History:  Procedure Laterality Date  . Aortic abdominal aneurysm endovascular repair with gore graft 09/19/2017  Dr. Florentina Addisoneonanan  . Appendectomy laparoscopic, intra operative ultrasound 10/16/14  Dr Thedore MinsSingh  . Coil embolization lt hypogastric art 09/05/2017  Dr. Netta NeatJoel Deonanan  . Knee arthroscopy Left 1992  Dr Rito Ehrlich'Keefe   Allergies  Allergen Reactions  . Lipitor [Atorvastatin]  . Lisinopril  angioedema  . Zocor [Simvastatin] Rash   No medications prior to admission.     Results for orders placed or performed during the hospital encounter of 03/02/20 (from the past 48 hour(s))  Basic metabolic panel     Status: Abnormal   Collection Time: 03/08/20  7:08 AM  Result Value Ref Range   Sodium 148 (H) 135 - 145 mmol/L   Potassium 2.7 (LL) 3.5 - 5.1 mmol/L    Comment: CRITICAL RESULT CALLED TO, READ BACK BY AND VERIFIED WITH: CAMPBELL,R RN @931  ON [id number] BY FLEMINGS    Chloride 105 98 - 111 mmol/L   CO2 27 22 - 32 mmol/L   Glucose, Bld 139 (H) 70 - 99 mg/dL    Comment: Glucose reference range applies only to samples taken after fasting for at least 8 hours.   BUN 129 (H) 8 - 23 mg/dL   Creatinine, Ser 6.212.49 (H) 0.61 - 1.24 mg/dL   Calcium 9.9 8.9 - 30.810.3 mg/dL   GFR calc non Af Amer 24 (L) >60 mL/min   GFR calc Af Amer 28 (L) >60 mL/min   Anion gap 16 (H) 5 - 15    Comment: Performed at [facility], [address] [place], [place] [id number]  CBC     Status: Abnormal   Collection Time: 03/08/20  7:08 AM  Result Value Ref Range   WBC 9.4 4.0 - 10.5 K/uL   RBC 2.72 (L) 4.22 - 5.81 MIL/uL   Hemoglobin 8.0 (L) 13.0 -  17.0 g/dL   HCT 27.5 (L) 17.0 - 01.7 %   MCV 101.8 (H) 80.0 - 100.0 fL   MCH 29.4 26.0 - 34.0 pg   MCHC 28.9 (L) 30.0 - 36.0 g/dL   RDW 49.4 (H) 49.6 - 75.9 %   Platelets 378 150 - 400 K/uL   nRBC 1.2 (H) 0.0 - 0.2 %    Comment: Performed at [facility], [address], [place], [place] [address]  Magnesium     Status: Abnormal   Collection Time: 03/08/20  7:08 AM  Result Value Ref Range   Magnesium 2.9 (H) 1.7 - 2.4 mg/dL    Comment: Performed at [facility], [address] [place], [place] [address]  Occult blood card to lab, stool     Status: Abnormal   Collection Time: 03/08/20  4:47 PM  Result Value Ref Range   Fecal Occult Bld POSITIVE (A) NEGATIVE    Comment: Performed at [facility], [address], [place], [place] [address]  Potassium     Status: None   Collection Time: 03/08/20  5:07 PM   Result Value Ref Range   Potassium 3.7 3.5 - 5.1 mmol/L    Comment: Performed at [facility], [address], [place], [place] [address]  CBC     Status: Abnormal   Collection Time: 03/08/20  5:07 PM  Result Value Ref Range   WBC 8.6 4.0 - 10.5 K/uL   RBC 2.56 (L) 4.22 - 5.81 MIL/uL   Hemoglobin 7.5 (L) 13.0 - 17.0 g/dL   HCT 03.0 (L) 09.2 - 33.0 %   MCV 101.2 (H) 80.0 - 100.0 fL   MCH 29.3 26.0 - 34.0 pg   MCHC 29.0 (L) 30.0 - 36.0 g/dL   RDW 07.6 (H) 22.6 - 33.3 %   Platelets 338 150 - 400 K/uL   nRBC 1.2 (H) 0.0 - 0.2 %    Comment: Performed at [facility], [address] [place], [place] [address]  CBC     Status: Abnormal   Collection Time: 03/08/20 11:50 PM  Result Value Ref Range   WBC 10.3 4.0 - 10.5 K/uL   RBC 2.62 (L) 4.22 - 5.81 MIL/uL   Hemoglobin 7.9 (L) 13.0 - 17.0 g/dL   HCT 56.3 (L) 89.3 - 73.4 %   MCV 106.1 (H) 80.0 - 100.0 fL   MCH 30.2 26.0 - 34.0 pg   MCHC 28.4 (L) 30.0 - 36.0 g/dL   RDW 28.7 (H) 68.1 - 15.7 %   Platelets 318 150 - 400 K/uL   nRBC 3.0 (H) 0.0 - 0.2 %    Comment: Performed at [facility], [address] [place], [place] [address]  Pathologist smear review     Status: None   Collection Time: 03/08/20 11:50 PM  Result Value Ref Range   Path Review Left shift with nucleated RBC     Comment: Macrocytic anemia Reviewed by Ferd Hibbs. Colonel Bald, M.D. 03/09/2020 Performed at [facility], [address] [place], [place] [address]   CBC     Status: Abnormal   Collection Time: 03/09/20  5:55 AM  Result Value Ref Range   WBC 11.6 (H) 4.0 - 10.5 K/uL   RBC 2.44 (L) 4.22 - 5.81 MIL/uL   Hemoglobin 7.5 (L) 13.0 - 17.0 g/dL   HCT 16.3 (L) 84.5 - 36.4 %   MCV 104.5 (H) 80.0 - 100.0 fL  MCH 30.7 26.0 - 34.0 pg   MCHC 29.4 (L) 30.0 - 36.0 g/dL   RDW 19.5 (H) 11.5 - 15.5 %   Platelets 344 150 - 400 K/uL   nRBC 2.6 (H) 0.0 - 0.2 %    Comment: Performed at [facility] [address] [place], [place] [address]   Basic metabolic panel     Status: Abnormal   Collection Time: 03/09/20  5:55 AM  Result Value Ref Range   Sodium 149 (H) 135 - 145 mmol/L   Potassium 3.4 (L) 3.5 - 5.1 mmol/L   Chloride 108 98 - 111 mmol/L   CO2 24 22 - 32 mmol/L   Glucose, Bld 126 (H) 70 - 99 mg/dL    Comment: Glucose reference range applies only to samples taken after fasting for at least 8 hours.   BUN 148 (H) 8 - 23 mg/dL   Creatinine, Ser 2.83 (H) 0.61 - 1.24 mg/dL   Calcium 9.6 8.9 - 10.3 mg/dL   GFR calc non Af Amer 21 (L) >60 mL/min   GFR calc Af Amer 24 (L) >60 mL/min   Anion gap 17 (H) 5 - 15    Comment: Performed at [facility] [address] [place], [place] [address]  CBC     Status: Abnormal   Collection Time: 03/09/20 10:20 AM  Result Value Ref Range   WBC 9.0 4.0 - 10.5 K/uL   RBC 2.38 (L) 4.22 - 5.81 MIL/uL   Hemoglobin 7.3 (L) 13.0 - 17.0 g/dL   HCT 25.1 (L) 39.0 - 52.0 %   MCV 105.5 (H) 80.0 - 100.0 fL   MCH 30.7 26.0 - 34.0 pg   MCHC 29.1 (L) 30.0 - 36.0 g/dL   RDW 19.7 (H) 11.5 - 15.5 %   Platelets 342 150 - 400 K/uL   nRBC 2.8 (H) 0.0 - 0.2 %    Comment: Performed at [facility] [address], [place], [place] [address]  Blood gas, arterial     Status: Abnormal   Collection Time: 03/09/20  3:30 PM  Result Value Ref Range   FIO2 35.00    pH, Arterial 7.344 (L) 7.350 - 7.450   pCO2 arterial 54.6 (H) 32.0 - 48.0 mmHg   pO2, Arterial 52.8 (L) 83.0 - 108.0 mmHg   Bicarbonate 29.1 (H) 20.0 - 28.0 mmol/L   Acid-Base Excess 3.7 (H) 0.0 - 2.0 mmol/L   O2 Saturation 82.2 %   Patient temperature 36.8    Collection site LEFT RADIAL    Drawn by COLLECTED BY RT     Comment: N.PAYNE   Sample type ARTERIAL DRAW    Allens test (pass/fail) PASS PASS    Comment: Performed at [facility], [address], [place], [place] [address]  Basic metabolic panel     Status: Abnormal   Collection Time: 03/09/20  3:46 PM  Result Value Ref Range   Sodium 149 (H) 135 - 145 mmol/L    Potassium 3.7 3.5 - 5.1 mmol/L   Chloride 109 98 - 111 mmol/L   CO2 29 22 - 32 mmol/L   Glucose, Bld 143 (H) 70 - 99 mg/dL    Comment: Glucose reference range applies only to samples taken after fasting for at least 8 hours.   BUN 146 (H) 8 - 23 mg/dL   Creatinine, Ser 2.75 (H) 0.61 - 1.24 mg/dL   Calcium 9.8 8.9 - 10.3 mg/dL   GFR calc non  Af Amer 22 (L) >60 mL/min   GFR calc Af Amer 25 (L) >60 mL/min   Anion gap 11 5 - 15    Comment: Performed at [facility], [address], [place], [place] [address]  CBC     Status: Abnormal   Collection Time: 03/09/20  3:46 PM  Result Value Ref Range   WBC 9.3 4.0 - 10.5 K/uL   RBC 2.41 (L) 4.22 - 5.81 MIL/uL   Hemoglobin 7.3 (L) 13.0 - 17.0 g/dL   HCT 97.3 (L) 53.2 - 99.2 %   MCV 104.6 (H) 80.0 - 100.0 fL   MCH 30.3 26.0 - 34.0 pg   MCHC 29.0 (L) 30.0 - 36.0 g/dL   RDW 42.6 (H) 83.4 - 19.6 %   Platelets 364 150 - 400 K/uL   nRBC 2.3 (H) 0.0 - 0.2 %    Comment: Performed at [facility], [address] [place], [place] [address]  Troponin I (High Sensitivity)     Status: Abnormal   Collection Time: 03/09/20  3:46 PM  Result Value Ref Range   Troponin I (High Sensitivity) 44 (H) <18 ng/L    Comment: (NOTE) Elevated high sensitivity troponin I (hsTnI) values and significant  changes across serial measurements may suggest ACS but many other  chronic and acute conditions are known to elevate hsTnI results.  Refer to the "Links" section for chest pain algorithms and additional  guidance. Performed at [facility], [address] [place], [place] [address]   Troponin I (High Sensitivity)     Status: Abnormal   Collection Time: 03/09/20  7:54 PM  Result Value Ref Range   Troponin I (High Sensitivity) 107 (HH) <18 ng/L    Comment: CRITICAL RESULT CALLED TO, READ BACK BY AND VERIFIED WITH: Boyce Medici RN [id number] K FORSYTH (NOTE) Elevated high sensitivity troponin I (hsTnI) values and significant  changes across serial  measurements may suggest ACS but many other  chronic and acute conditions are known to elevate hsTnI results.  Refer to the Links section for chest pain algorithms and additional  guidance. Performed at [facility], [address], [place], [place] [address]    US RENAL  Result Date: 03/08/2020 CLINICAL DATA:  Acute kidney injury EXAM: RENAL / URINARY TRACT ULTRASOUND COMPLETE COMPARISON:  None. FINDINGS: Right Kidney: Renal measurements: 11.4 x 4.8 x 5.2 cm = volume: 149.4 mL . Echogenicity within normal limits. No mass or hydronephrosis visualized. Left Kidney: Not identified. Bladder: Decompressed by Foley catheter. Other: None. IMPRESSION: 1. Normal ultrasound appearance of the right kidney. 2. Nonvisualized left kidney which may be congenitally or surgically absent versus severely atrophied. Electronically Signed   By: Jasmine Pang M.D.   On: 03/08/2020 19:15   DG Chest Port 1 View  Result Date: 03/09/2020 CLINICAL DATA:  Acute on chronic respiratory failure EXAM: PORTABLE CHEST 1 VIEW COMPARISON:  03/02/2020 FINDINGS: Stable cardiomediastinal contours. Tracheostomy tube remains in place. Diffuse coarsened interstitial markings throughout both lungs is similar in appearance to the previous study. No definite new superimposed airspace consolidation. No pneumothorax is seen. IMPRESSION: No significant interval change. Findings favor chronic interstitial lung changes. Electronically Signed   By: Duanne Guess D.O.   On: 03/09/2020 16:51    Review Of Systems As per HPI  P: 70, R: 30, BP: 121/69, T: 97.8, O2 sat 99 %. There were no vitals taken for this visit. There is no height or weight on  file to calculate BMI. General appearance: alert, cooperative, appears stated age and no distress Head: Normocephalic, atraumatic. Eyes: Brown eyes, pale conjunctiva, corneas clear.  Neck: No adenopathy, no carotid bruit, no JVD, supple, symmetrical Resp: Clearing to auscultation  bilaterally. Cardio: Regular rate and rhythm, S1, S2 normal, II/VI systolic murmur, no click, rub or gallop GI: Soft, non-tender; bowel sounds normal; no organomegaly. Extremities: No edema, cyanosis or clubbing. Skin: Warm and dry.  Neurologic: Alert and oriented X 2, Limited motion of all 4 extremities.  Assessment/Plan Acute on chronic respiratory failure with hypoxia COVID-19 infection Paroxysmal atrial fibrillation CKD, IV Healthcare associated pneumonia Abnormal Troponin I, suspect demand ischemia  12 lead EKG. Medical treatment as much as possible.  Time spent: Review of old records, Lab, x-rays, EKG, other cardiac tests, examination, discussion with patient, Nurse and PA over 70 minutes.  Ricki Rodriguez, MD  03/09/2020, 9:10 PM

## 2020-03-09 NOTE — Progress Notes (Signed)
Pulmonary Critical Care Medicine [facility] [place]   PULMONARY CRITICAL CARE SERVICE  PROGRESS NOTE  Date of Service: 03/09/2020  Russell Rios  KGY:[id number]  DOB: 1946/11/18   DOA: 03/02/2020  Referring Physician: Carron Curie, MD  HPI: Russell Rios is a 74 y.o. male seen for follow up of Acute on Chronic Respiratory Failure.  Patient currently is on the NAG has been on 4 L of oxygen for a goal of about 4 hours other issues right now are the patient's renal function has been worsening nephrology consultation is pending also has been some acute GI blood loss which is being worked up.  The patient's most recent creatinine was up to 2.83 and hemoglobin has dropped from 9.0 down to 7.5  Medications: Reviewed on Rounds  Physical Exam:  Vitals: Temperature 97.8 pulse 78 respiratory rate 34 blood pressure is 121/69 saturations are 99%  Ventilator Settings on the NAG on 4 L  . General: Comfortable at this time . Eyes: Grossly normal lids, irises & conjunctiva . ENT: grossly tongue is normal . Neck: no obvious mass . Cardiovascular: S1 S2 normal no gallop . Respiratory: No rhonchi no rales are noted at this time . Abdomen: soft . Skin: no rash seen on limited exam . Musculoskeletal: not rigid . Psychiatric:unable to assess . Neurologic: no seizure no involuntary movements         Lab Data:   Basic Metabolic Panel: Recent Labs  Lab 03/03/20 0641 03/08/20 0708 03/08/20 1707 03/09/20 0555  NA 142 148*  --  149*  K 4.1 2.7* 3.7 3.4*  CL 101 105  --  108  CO2 29 27  --  24  GLUCOSE 113* 139*  --  126*  BUN 57* 129*  --  148*  CREATININE 1.67* 2.49*  --  2.83*  CALCIUM 9.6 9.9  --  9.6  MG 2.4 2.9*  --   --   PHOS 5.3*  --   --   --     ABG: Recent Labs  Lab 03/02/20 2312  PHART 7.343*  PCO2ART 58.3*  PO2ART 122*  HCO3 30.8*  O2SAT 98.5    Liver Function Tests: Recent Labs  Lab 03/03/20 0641  AST 37  ALT 66*  ALKPHOS 83  BILITOT 0.8  PROT 7.8   ALBUMIN 2.5*   No results for input(s): LIPASE, AMYLASE in the last 168 hours. No results for input(s): AMMONIA in the last 168 hours.  CBC: Recent Labs  Lab 03/03/20 0641 03/03/20 0641 03/08/20 0708 03/08/20 1707 03/08/20 2350 03/09/20 0555 03/09/20 1020  WBC 7.2   < > 9.4 8.6 10.3 11.6* 9.0  NEUTROABS 5.0  --   --   --   --   --   --   HGB 9.0*   < > 8.0* 7.5* 7.9* 7.5* 7.3*  HCT 30.7*   < > 27.7* 25.9* 27.8* 25.5* 25.1*  MCV 100.3*   < > 101.8* 101.2* 106.1* 104.5* 105.5*  PLT 364   < > 378 338 318 344 342   < > = values in this interval not displayed.    Cardiac Enzymes: No results for input(s): CKTOTAL, CKMB, CKMBINDEX, TROPONINI in the last 168 hours.  BNP (last 3 results) No results for input(s): BNP in the last 8760 hours.  ProBNP (last 3 results) No results for input(s): PROBNP in the last 8760 hours.  Radiological Exams: US RENAL  Result Date: 03/08/2020 CLINICAL DATA:  Acute kidney injury EXAM: RENAL / URINARY  TRACT ULTRASOUND COMPLETE COMPARISON:  None. FINDINGS: Right Kidney: Renal measurements: 11.4 x 4.8 x 5.2 cm = volume: 149.4 mL . Echogenicity within normal limits. No mass or hydronephrosis visualized. Left Kidney: Not identified. Bladder: Decompressed by Foley catheter. Other: None. IMPRESSION: 1. Normal ultrasound appearance of the right kidney. 2. Nonvisualized left kidney which may be congenitally or surgically absent versus severely atrophied. Electronically Signed   By: Donavan Foil M.D.   On: 03/08/2020 19:15    Assessment/Plan Active Problems:   Acute on chronic respiratory failure with hypoxia (HCC)   COVID-19 virus infection   Chronic atrial fibrillation with rapid ventricular response (HCC)   Chronic kidney disease, stage III (moderate)   Healthcare-associated pneumonia   1. Acute on chronic respiratory failure hypoxia patient will continue on the NAG titrate oxygen continue pulmonary toilet.  Goal today is for 4 hours 2. COVID-19 virus  infection in resolution phase we will continue with supportive care 3. Chronic atrial fibrillation rate controlled 4. Chronic kidney disease stage III worsening renal function nephrology consultation has been asked for 5. Healthcare associated pneumonia treated we will continue with supportive care   I have personally seen and evaluated the patient, evaluated laboratory and imaging results, formulated the assessment and plan and placed orders. The Patient requires high complexity decision making with multiple systems involvement.  Rounds were done with the Respiratory Therapy Director and Staff therapists and discussed with nursing staff also.  Allyne Gee, MD [facility] Pulmonary Critical Care Medicine Sleep Medicine

## 2020-03-10 DIAGNOSIS — I482 Chronic atrial fibrillation, unspecified: Secondary | ICD-10-CM | POA: Diagnosis not present

## 2020-03-10 DIAGNOSIS — U071 COVID-19: Secondary | ICD-10-CM | POA: Diagnosis not present

## 2020-03-10 DIAGNOSIS — N183 Chronic kidney disease, stage 3 unspecified: Secondary | ICD-10-CM | POA: Diagnosis not present

## 2020-03-10 DIAGNOSIS — J9621 Acute and chronic respiratory failure with hypoxia: Secondary | ICD-10-CM | POA: Diagnosis not present

## 2020-03-10 LAB — CBC
HCT: 24.7 % — ABNORMAL LOW (ref 39.0–52.0)
Hemoglobin: 7.1 g/dL — ABNORMAL LOW (ref 13.0–17.0)
MCH: 30.6 pg (ref 26.0–34.0)
MCHC: 28.7 g/dL — ABNORMAL LOW (ref 30.0–36.0)
MCV: 106.5 fL — ABNORMAL HIGH (ref 80.0–100.0)
Platelets: 351 10*3/uL (ref 150–400)
RBC: 2.32 MIL/uL — ABNORMAL LOW (ref 4.22–5.81)
RDW: 20.5 % — ABNORMAL HIGH (ref 11.5–15.5)
WBC: 9.1 10*3/uL (ref 4.0–10.5)
nRBC: 2.5 % — ABNORMAL HIGH (ref 0.0–0.2)

## 2020-03-10 LAB — BASIC METABOLIC PANEL
Anion gap: 12 (ref 5–15)
BUN: 142 mg/dL — ABNORMAL HIGH (ref 8–23)
CO2: 29 mmol/L (ref 22–32)
Calcium: 9.6 mg/dL (ref 8.9–10.3)
Chloride: 108 mmol/L (ref 98–111)
Creatinine, Ser: 2.56 mg/dL — ABNORMAL HIGH (ref 0.61–1.24)
GFR calc Af Amer: 27 mL/min — ABNORMAL LOW (ref >60)
GFR calc non Af Amer: 24 mL/min — ABNORMAL LOW (ref >60)
Glucose, Bld: 144 mg/dL — ABNORMAL HIGH (ref 70–99)
Potassium: 3.5 mmol/L (ref 3.5–5.1)
Sodium: 149 mmol/L — ABNORMAL HIGH (ref 135–145)

## 2020-03-10 LAB — TROPONIN I (HIGH SENSITIVITY): Troponin I (High Sensitivity): 123 ng/L (ref <18)

## 2020-03-10 LAB — ABO/RH: ABO/RH(D): O POS

## 2020-03-10 LAB — PREPARE RBC (CROSSMATCH)

## 2020-03-10 NOTE — Consult Note (Signed)
Ref: No primary care provider on file.   Subjective:  Resting comfortably. Remains on ventilator support.  Objective:  Vital Signs in the last 24 hours: BP: 120/70 P: 80 R: 28  Physical Exam: BP Readings from Last 1 Encounters:  No data found for BP     Wt Readings from Last 1 Encounters:  No data found for Wt    Weight change:  There is no height or weight on file to calculate BMI. HEENT: [place]/AT, Eyes-Brown, Conjunctiva-Pale, Sclera-Non-icteric Neck: No JVD, No bruit, Trachea midline. Lungs:  Clearing, Bilateral. Cardiac:  Regular rhythm, normal S1 and S2, no S3. II/VI systolic murmur. Abdomen:  Soft, non-tender. BS present. Extremities:  No edema present. No cyanosis. No clubbing. CNS: AxOx2, Cranial nerves grossly intact, limited motion on all 4 extremities.  Skin: Warm and dry.   Intake/Output from previous day: No intake/output data recorded.    Lab Results: BMET    Component Value Date/Time   NA 149 (H) 03/10/2020 0618   NA 149 (H) 03/09/2020 1546   NA 149 (H) 03/09/2020 0555   K 3.5 03/10/2020 0618   K 3.7 03/09/2020 1546   K 3.4 (L) 03/09/2020 0555   CL 108 03/10/2020 0618   CL 109 03/09/2020 1546   CL 108 03/09/2020 0555   CO2 29 03/10/2020 0618   CO2 29 03/09/2020 1546   CO2 24 03/09/2020 0555   GLUCOSE 144 (H) 03/10/2020 0618   GLUCOSE 143 (H) 03/09/2020 1546   GLUCOSE 126 (H) 03/09/2020 0555   BUN 142 (H) 03/10/2020 0618   BUN 146 (H) 03/09/2020 1546   BUN 148 (H) 03/09/2020 0555   CREATININE 2.56 (H) 03/10/2020 0618   CREATININE 2.75 (H) 03/09/2020 1546   CREATININE 2.83 (H) 03/09/2020 0555   CALCIUM 9.6 03/10/2020 0618   CALCIUM 9.8 03/09/2020 1546   CALCIUM 9.6 03/09/2020 0555   GFRNONAA 24 (L) 03/10/2020 0618   GFRNONAA 22 (L) 03/09/2020 1546   GFRNONAA 21 (L) 03/09/2020 0555   GFRAA 27 (L) 03/10/2020 0618   GFRAA 25 (L) 03/09/2020 1546   GFRAA 24 (L) 03/09/2020 0555   CBC    Component Value Date/Time   WBC 9.1 03/10/2020 0618   RBC 2.32 (L) 03/10/2020 0618   HGB 7.1 (L) 03/10/2020 0618   HCT 24.7 (L) 03/10/2020 0618   PLT 351 03/10/2020 0618   MCV 106.5 (H) 03/10/2020 0618   MCH 30.6 03/10/2020 0618   MCHC 28.7 (L) 03/10/2020 0618   RDW 20.5 (H) 03/10/2020 0618   LYMPHSABS 1.5 03/03/2020 0641   MONOABS 0.6 03/03/2020 0641   EOSABS 0.1 03/03/2020 0641   BASOSABS 0.0 03/03/2020 0641   HEPATIC Function Panel Recent Labs    03/03/20 0641  PROT 7.8   HEMOGLOBIN A1C No components found for: HGA1C,  MPG CARDIAC ENZYMES No results found for: CKTOTAL, CKMB, CKMBINDEX, TROPONINI BNP No results for input(s): PROBNP in the last 8760 hours. TSH No results for input(s): TSH in the last 8760 hours. CHOLESTEROL No results for input(s): CHOL in the last 8760 hours.  Scheduled Meds: Continuous Infusions: PRN Meds:.iohexol  Assessment/Plan: Acute on chronic respiratory failure with hypoxia COVID-19 infection Healthcare associated pneumonia CKD, IV Anemia of chronic disease Paroxysmal atrial fibrillation Abnormal troponin I levels, possibly from demand ischemia  Echocardiogram for LVF. Continue medical treatment.   LOS: 0 days   Time spent including chart review, lab review, examination, discussion with patient, nurse and family member : 30 min   Izeyah Deike India  MD  03/10/2020, 1:47 PM

## 2020-03-10 NOTE — Progress Notes (Addendum)
Pulmonary Critical Care Medicine [place]   PULMONARY CRITICAL CARE SERVICE  PROGRESS NOTE  Date of Service: 03/10/2020  Russell Rios  WFU:[id number]  DOB: 06/28/1946   DOA: 03/02/2020  Referring Physician: Merton Border, MD  HPI: Russell Rios is a 74 y.o. male seen for follow up of Acute on Chronic Respiratory Failure.  Patient continues on nag 4 L for an 8-hour goal today satting well no distress.  Medications: Reviewed on Rounds  Physical Exam:  Vitals: Pulse 99 respirations 40 BP 122/62 O2 sat 100% temp 98.3  Ventilator Settings NAG 4 L  . General: Comfortable at this time . Eyes: Grossly normal lids, irises & conjunctiva . ENT: grossly tongue is normal . Neck: no obvious mass . Cardiovascular: S1 S2 normal no gallop . Respiratory: No rales or rhonchi noted . Abdomen: soft . Skin: no rash seen on limited exam . Musculoskeletal: not rigid . Psychiatric:unable to assess . Neurologic: no seizure no involuntary movements         Lab Data:   Basic Metabolic Panel: Recent Labs  Lab 03/08/20 0708 03/08/20 1707 03/09/20 0555 03/09/20 1546 03/10/20 0618  NA 148*  --  149* 149* 149*  K 2.7* 3.7 3.4* 3.7 3.5  CL 105  --  108 109 108  CO2 27  --  24 29 29   GLUCOSE 139*  --  126* 143* 144*  BUN 129*  --  148* 146* 142*  CREATININE 2.49*  --  2.83* 2.75* 2.56*  CALCIUM 9.9  --  9.6 9.8 9.6  MG 2.9*  --   --   --   --     ABG: Recent Labs  Lab 03/09/20 1530  PHART 7.344*  PCO2ART 54.6*  PO2ART 52.8*  HCO3 29.1*  O2SAT 82.2    Liver Function Tests: No results for input(s): AST, ALT, ALKPHOS, BILITOT, PROT, ALBUMIN in the last 168 hours. No results for input(s): LIPASE, AMYLASE in the last 168 hours. No results for input(s): AMMONIA in the last 168 hours.  CBC: Recent Labs  Lab 03/08/20 2350 03/09/20 0555 03/09/20 1020 03/09/20 1546 03/10/20 0618  WBC 10.3 11.6* 9.0 9.3 9.1  HGB 7.9* 7.5* 7.3* 7.3* 7.1*  HCT 27.8* 25.5* 25.1*  25.2* 24.7*  MCV 106.1* 104.5* 105.5* 104.6* 106.5*  PLT 318 344 342 364 351    Cardiac Enzymes: No results for input(s): CKTOTAL, CKMB, CKMBINDEX, TROPONINI in the last 168 hours.  BNP (last 3 results) No results for input(s): BNP in the last 8760 hours.  ProBNP (last 3 results) No results for input(s): PROBNP in the last 8760 hours.  Radiological Exams: US RENAL  Result Date: 03/08/2020 CLINICAL DATA:  Acute kidney injury EXAM: RENAL / URINARY TRACT ULTRASOUND COMPLETE COMPARISON:  None. FINDINGS: Right Kidney: Renal measurements: 11.4 x 4.8 x 5.2 cm = volume: 149.4 mL . Echogenicity within normal limits. No mass or hydronephrosis visualized. Left Kidney: Not identified. Bladder: Decompressed by Foley catheter. Other: None. IMPRESSION: 1. Normal ultrasound appearance of the right kidney. 2. Nonvisualized left kidney which may be congenitally or surgically absent versus severely atrophied. Electronically Signed   By: Donavan Foil M.D.   On: 03/08/2020 19:15   DG Chest Port 1 View  Result Date: 03/09/2020 CLINICAL DATA:  Acute on chronic respiratory failure EXAM: PORTABLE CHEST 1 VIEW COMPARISON:  03/02/2020 FINDINGS: Stable cardiomediastinal contours. Tracheostomy tube remains in place. Diffuse coarsened interstitial markings throughout both lungs is similar in appearance to the previous study. No definite  new superimposed airspace consolidation. No pneumothorax is seen. IMPRESSION: No significant interval change. Findings favor chronic interstitial lung changes. Electronically Signed   By: Duanne Guess D.O.   On: 03/09/2020 16:51    Assessment/Plan Active Problems:   Acute on chronic respiratory failure with hypoxia (HCC)   COVID-19 virus infection   Chronic atrial fibrillation with rapid ventricular response (HCC)   Chronic kidney disease, stage III (moderate)   Healthcare-associated pneumonia   1. Acute on chronic respiratory failure hypoxia patient will continue on the NAG  titrate oxygen continue pulmonary toilet.  Goal today is for 8 hours 2. COVID-19 virus infection in resolution phase we will continue with supportive care 3. Chronic atrial fibrillation rate controlled 4. Chronic kidney disease stage III worsening renal function nephrology consultation has been asked for 5. Healthcare associated pneumonia treated we will continue with supportive care   I have personally seen and evaluated the patient, evaluated laboratory and imaging results, formulated the assessment and plan and placed orders. The Patient requires high complexity decision making with multiple systems involvement.  Rounds were done with the Respiratory Therapy Director and Staff therapists and discussed with nursing staff also.  Yevonne Pax, MD [facility] Pulmonary Critical Care Medicine Sleep Medicine

## 2020-03-11 ENCOUNTER — Other Ambulatory Visit (HOSPITAL_COMMUNITY): Payer: Medicare Other

## 2020-03-11 DIAGNOSIS — J9621 Acute and chronic respiratory failure with hypoxia: Secondary | ICD-10-CM | POA: Diagnosis not present

## 2020-03-11 DIAGNOSIS — I482 Chronic atrial fibrillation, unspecified: Secondary | ICD-10-CM | POA: Diagnosis not present

## 2020-03-11 DIAGNOSIS — U071 COVID-19: Secondary | ICD-10-CM | POA: Diagnosis not present

## 2020-03-11 DIAGNOSIS — N183 Chronic kidney disease, stage 3 unspecified: Secondary | ICD-10-CM | POA: Diagnosis not present

## 2020-03-11 LAB — BASIC METABOLIC PANEL
Anion gap: 10 (ref 5–15)
BUN: 96 mg/dL — ABNORMAL HIGH (ref 8–23)
CO2: 29 mmol/L (ref 22–32)
Calcium: 9.3 mg/dL (ref 8.9–10.3)
Chloride: 112 mmol/L — ABNORMAL HIGH (ref 98–111)
Creatinine, Ser: 1.74 mg/dL — ABNORMAL HIGH (ref 0.61–1.24)
GFR calc Af Amer: 44 mL/min — ABNORMAL LOW (ref >60)
GFR calc non Af Amer: 38 mL/min — ABNORMAL LOW (ref >60)
Glucose, Bld: 138 mg/dL — ABNORMAL HIGH (ref 70–99)
Potassium: 3.6 mmol/L (ref 3.5–5.1)
Sodium: 151 mmol/L — ABNORMAL HIGH (ref 135–145)

## 2020-03-11 LAB — TYPE AND SCREEN
ABO/RH(D): O POS
Antibody Screen: NEGATIVE
Unit division: 0

## 2020-03-11 LAB — BPAM RBC
Blood Product Expiration Date: 202105252359
ISSUE DATE / TIME: 202104211655
Unit Type and Rh: 5100

## 2020-03-11 LAB — HEMOGLOBIN AND HEMATOCRIT, BLOOD
HCT: 24.3 % — ABNORMAL LOW (ref 39.0–52.0)
Hemoglobin: 7.2 g/dL — ABNORMAL LOW (ref 13.0–17.0)

## 2020-03-11 NOTE — Consult Note (Signed)
Ref: No primary care provider on file.   Subjective:  Awake. Patient on NAG with 4L of Oxygen. Echocardiogram: Preserved LV systolic function. EF 55-60 %. Moderate LVH. Mild MR and moderately sclerosed AV.  Objective:  Vital Signs in the last 24 hours: BP:111/37, P: 74, R: 28, T: 97.6 and saturation 100 %. Physical Exam: BP Readings from Last 1 Encounters:  No data found for BP     Wt Readings from Last 1 Encounters:  No data found for Wt    Weight change:  There is no height or weight on file to calculate BMI. HEENT: [place]/AT, Eyes-Brown, Conjunctiva-Pale, Sclera-Non-icteric Neck: No JVD, No bruit, Trachea midline. Lungs:  Clearing, Bilateral. Cardiac:  Regular rhythm, normal S1 and S2, no S3. III/VI systolic murmur. Abdomen:  Soft, non-tender. BS present. Extremities:  No edema present. No cyanosis. No clubbing. CNS: AxOx2, Cranial nerves grossly intact, moves all 4 extremities.  Skin: Warm and dry.   Intake/Output from previous day: No intake/output data recorded.    Lab Results: BMET    Component Value Date/Time   NA 151 (H) 03/11/2020 0708   NA 149 (H) 03/10/2020 0618   NA 149 (H) 03/09/2020 1546   K 3.6 03/11/2020 0708   K 3.5 03/10/2020 0618   K 3.7 03/09/2020 1546   CL 112 (H) 03/11/2020 0708   CL 108 03/10/2020 0618   CL 109 03/09/2020 1546   CO2 29 03/11/2020 0708   CO2 29 03/10/2020 0618   CO2 29 03/09/2020 1546   GLUCOSE 138 (H) 03/11/2020 0708   GLUCOSE 144 (H) 03/10/2020 0618   GLUCOSE 143 (H) 03/09/2020 1546   BUN 96 (H) 03/11/2020 0708   BUN 142 (H) 03/10/2020 0618   BUN 146 (H) 03/09/2020 1546   CREATININE 1.74 (H) 03/11/2020 0708   CREATININE 2.56 (H) 03/10/2020 0618   CREATININE 2.75 (H) 03/09/2020 1546   CALCIUM 9.3 03/11/2020 0708   CALCIUM 9.6 03/10/2020 0618   CALCIUM 9.8 03/09/2020 1546   GFRNONAA 38 (L) 03/11/2020 0708   GFRNONAA 24 (L) 03/10/2020 0618   GFRNONAA 22 (L) 03/09/2020 1546   GFRAA 44 (L) 03/11/2020 0708   GFRAA 27 (L)  03/10/2020 0618   GFRAA 25 (L) 03/09/2020 1546   CBC    Component Value Date/Time   WBC 9.1 03/10/2020 0618   RBC 2.32 (L) 03/10/2020 0618   HGB 7.2 (L) 03/11/2020 0708   HCT 24.3 (L) 03/11/2020 0708   PLT 351 03/10/2020 0618   MCV 106.5 (H) 03/10/2020 0618   MCH 30.6 03/10/2020 0618   MCHC 28.7 (L) 03/10/2020 0618   RDW 20.5 (H) 03/10/2020 0618   LYMPHSABS 1.5 03/03/2020 0641   MONOABS 0.6 03/03/2020 0641   EOSABS 0.1 03/03/2020 0641   BASOSABS 0.0 03/03/2020 0641   HEPATIC Function Panel Recent Labs    03/03/20 0641  PROT 7.8   HEMOGLOBIN A1C No components found for: HGA1C,  MPG CARDIAC ENZYMES No results found for: CKTOTAL, CKMB, CKMBINDEX, TROPONINI BNP No results for input(s): PROBNP in the last 8760 hours. TSH No results for input(s): TSH in the last 8760 hours. CHOLESTEROL No results for input(s): CHOL in the last 8760 hours.  Scheduled Meds: Continuous Infusions: PRN Meds:.iohexol  Assessment/Plan: Acute on chronic respiratory failure with hypoxia COVID-19 infection Healthcare associated pneumonia CKD, IV Anemia of chronic disease Paroxysmal atrial fibrillation Mild MR. 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   LOS: 0 days   Time spent including chart review, lab review, examination,  discussion with patient, nurse and tech : 30 min   Orpah Cobb  MD  03/11/2020, 11:05 AM

## 2020-03-11 NOTE — Progress Notes (Signed)
  Echocardiogram 2D Echocardiogram has been performed.  Russell Rios 03/11/2020, 10:33 AM

## 2020-03-11 NOTE — Progress Notes (Signed)
Pulmonary Critical Care Medicine [place]   PULMONARY CRITICAL CARE SERVICE  PROGRESS NOTE  Date of Service: 03/11/2020  Russell Rios  QMV:[id number]  DOB: Jan 24, 1946   DOA: 03/02/2020  Referring Physician: Merton Border, MD  HPI: Russell Rios is a 74 y.o. male seen for follow up of Acute on Chronic Respiratory Failure.  Patient is on the NAG and requiring 4 L of oxygen  Medications: Reviewed on Rounds  Physical Exam:  Vitals: Temperature is 97.6 pulse 74 respiratory 28 blood pressure is 111/37 saturations 100%  Ventilator Settings off the ventilator on NAG  . General: Comfortable at this time . Eyes: Grossly normal lids, irises & conjunctiva . ENT: grossly tongue is normal . Neck: no obvious mass . Cardiovascular: S1 S2 normal no gallop . Respiratory: No rhonchi no rales are noted at this time . Abdomen: soft . Skin: no rash seen on limited exam . Musculoskeletal: not rigid . Psychiatric:unable to assess . Neurologic: no seizure no involuntary movements         Lab Data:   Basic Metabolic Panel: Recent Labs  Lab 03/08/20 0708 03/08/20 0708 03/08/20 1707 03/09/20 0555 03/09/20 1546 03/10/20 0618 03/11/20 0708  NA 148*  --   --  149* 149* 149* 151*  K 2.7*   < > 3.7 3.4* 3.7 3.5 3.6  CL 105  --   --  108 109 108 112*  CO2 27  --   --  24 29 29 29   GLUCOSE 139*  --   --  126* 143* 144* 138*  BUN 129*  --   --  148* 146* 142* 96*  CREATININE 2.49*  --   --  2.83* 2.75* 2.56* 1.74*  CALCIUM 9.9  --   --  9.6 9.8 9.6 9.3  MG 2.9*  --   --   --   --   --   --    < > = values in this interval not displayed.    ABG: Recent Labs  Lab 03/09/20 1530  PHART 7.344*  PCO2ART 54.6*  PO2ART 52.8*  HCO3 29.1*  O2SAT 82.2    Liver Function Tests: No results for input(s): AST, ALT, ALKPHOS, BILITOT, PROT, ALBUMIN in the last 168 hours. No results for input(s): LIPASE, AMYLASE in the last 168 hours. No results for input(s): AMMONIA in the last  168 hours.  CBC: Recent Labs  Lab 03/08/20 2350 03/09/20 0555 03/09/20 1020 03/09/20 1546 03/10/20 0618  WBC 10.3 11.6* 9.0 9.3 9.1  HGB 7.9* 7.5* 7.3* 7.3* 7.1*  HCT 27.8* 25.5* 25.1* 25.2* 24.7*  MCV 106.1* 104.5* 105.5* 104.6* 106.5*  PLT 318 344 342 364 351    Cardiac Enzymes: No results for input(s): CKTOTAL, CKMB, CKMBINDEX, TROPONINI in the last 168 hours.  BNP (last 3 results) No results for input(s): BNP in the last 8760 hours.  ProBNP (last 3 results) No results for input(s): PROBNP in the last 8760 hours.  Radiological Exams: DG Chest Port 1 View  Result Date: 03/09/2020 CLINICAL DATA:  Acute on chronic respiratory failure EXAM: PORTABLE CHEST 1 VIEW COMPARISON:  03/02/2020 FINDINGS: Stable cardiomediastinal contours. Tracheostomy tube remains in place. Diffuse coarsened interstitial markings throughout both lungs is similar in appearance to the previous study. No definite new superimposed airspace consolidation. No pneumothorax is seen. IMPRESSION: No significant interval change. Findings favor chronic interstitial lung changes. Electronically Signed   By: Davina Poke D.O.   On: 03/09/2020 16:51    Assessment/Plan Active Problems:  Acute on chronic respiratory failure with hypoxia (HCC)   COVID-19 virus infection   Chronic atrial fibrillation with rapid ventricular response (HCC)   Chronic kidney disease, stage III (moderate)   Healthcare-associated pneumonia   1. Acute on chronic respiratory failure hypoxia we will continue with the NAG goal is 12 hours today 2. COVID-19 virus infection resolved 3. Chronic atrial fibrillation rate controlled 4. Chronic kidney disease stage III continue to monitor. 5. Healthcare associated pneumonia last chest x-ray showing chronic interstitial changes no new acute changes are noted   I have personally seen and evaluated the patient, evaluated laboratory and imaging results, formulated the assessment and plan and  placed orders. The Patient requires high complexity decision making with multiple systems involvement.  Rounds were done with the Respiratory Therapy Director and Staff therapists and discussed with nursing staff also.  Yevonne Pax, MD [facility] Pulmonary Critical Care Medicine Sleep Medicine

## 2020-03-12 DIAGNOSIS — J9621 Acute and chronic respiratory failure with hypoxia: Secondary | ICD-10-CM | POA: Diagnosis not present

## 2020-03-12 DIAGNOSIS — N183 Chronic kidney disease, stage 3 unspecified: Secondary | ICD-10-CM | POA: Diagnosis not present

## 2020-03-12 DIAGNOSIS — I482 Chronic atrial fibrillation, unspecified: Secondary | ICD-10-CM | POA: Diagnosis not present

## 2020-03-12 DIAGNOSIS — U071 COVID-19: Secondary | ICD-10-CM | POA: Diagnosis not present

## 2020-03-12 NOTE — Progress Notes (Addendum)
Pulmonary Critical Care Medicine [facility] [place]   PULMONARY CRITICAL CARE SERVICE  PROGRESS NOTE  Date of Service: 03/12/2020  Russell Rios  PFX:[id number]  DOB: 03-13-1946   DOA: 03/02/2020  Referring Physician: Carron Curie, MD  HPI: Russell Rios is a 74 y.o. male seen for follow up of Acute on Chronic Respiratory Failure.  Patient has a 16-hour goal today on NAG 4 L satting well at this time does continue to have a little bit of bleeding from trach site is requiring frequent suctioning.  Respiratory is doing eyes lavages.  Medications: Reviewed on Rounds  Physical Exam:  Vitals: Pulse 73 respirations 22 BP 99/54 O2 sat 100% temp 98.2  Ventilator Settings NAG 4 L  . General: Comfortable at this time . Eyes: Grossly normal lids, irises & conjunctiva . ENT: grossly tongue is normal . Neck: no obvious mass . Cardiovascular: S1 S2 normal no gallop . Respiratory: No rales or rhonchi noted . Abdomen: soft . Skin: no rash seen on limited exam . Musculoskeletal: not rigid . Psychiatric:unable to assess . Neurologic: no seizure no involuntary movements         Lab Data:   Basic Metabolic Panel: Recent Labs  Lab 03/08/20 0708 03/08/20 0708 03/08/20 1707 03/09/20 0555 03/09/20 1546 03/10/20 0618 03/11/20 0708  NA 148*  --   --  149* 149* 149* 151*  K 2.7*   < > 3.7 3.4* 3.7 3.5 3.6  CL 105  --   --  108 109 108 112*  CO2 27  --   --  24 29 29 29   GLUCOSE 139*  --   --  126* 143* 144* 138*  BUN 129*  --   --  148* 146* 142* 96*  CREATININE 2.49*  --   --  2.83* 2.75* 2.56* 1.74*  CALCIUM 9.9  --   --  9.6 9.8 9.6 9.3  MG 2.9*  --   --   --   --   --   --    < > = values in this interval not displayed.    ABG: Recent Labs  Lab 03/09/20 1530  PHART 7.344*  PCO2ART 54.6*  PO2ART 52.8*  HCO3 29.1*  O2SAT 82.2    Liver Function Tests: No results for input(s): AST, ALT, ALKPHOS, BILITOT, PROT, ALBUMIN in the last 168 hours. No results for  input(s): LIPASE, AMYLASE in the last 168 hours. No results for input(s): AMMONIA in the last 168 hours.  CBC: Recent Labs  Lab 03/08/20 2350 03/08/20 2350 03/09/20 0555 03/09/20 1020 03/09/20 1546 03/10/20 0618 03/11/20 0708  WBC 10.3  --  11.6* 9.0 9.3 9.1  --   HGB 7.9*   < > 7.5* 7.3* 7.3* 7.1* 7.2*  HCT 27.8*   < > 25.5* 25.1* 25.2* 24.7* 24.3*  MCV 106.1*  --  104.5* 105.5* 104.6* 106.5*  --   PLT 318  --  344 342 364 351  --    < > = values in this interval not displayed.    Cardiac Enzymes: No results for input(s): CKTOTAL, CKMB, CKMBINDEX, TROPONINI in the last 168 hours.  BNP (last 3 results) No results for input(s): BNP in the last 8760 hours.  ProBNP (last 3 results) No results for input(s): PROBNP in the last 8760 hours.  Radiological Exams: ECHOCARDIOGRAM COMPLETE  Result Date: 03/11/2020    ECHOCARDIOGRAM REPORT   Patient Name:   Russell Rios Date of Exam: 03/11/2020 Medical Rec #:  03/13/2020  Height: Accession #:    [id number]   Weight: Date of Birth:  August 16, 1946    BSA: Patient Age:    74 years     BP:           111/37 mmHg Patient Gender: M            HR:           88 bpm. Exam Location:  Inpatient Procedure: 2D Echo, Cardiac Doppler and Color Doppler Indications:     Acute Coronary Syndrome I24.9  History:         Patient has no prior history of Echocardiogram examinations.                  Arrythmias:Atrial Fibrillation. CKD.  Sonographer:     Ross Ludwig RDCS (AE) Referring Phys:  [id number] ALI HIJAZI Diagnosing Phys: Orpah Cobb MD  Sonographer Comments: Limited patient mobility. Tracheostomy tube in place. IMPRESSIONS  1. Left ventricular ejection fraction, by estimation, is 55 to 60%. The left ventricle has low normal function. The left ventricle demonstrates regional wall motion abnormalities (see scoring diagram/findings for description). There is moderate concentric left ventricular hypertrophy of the anterior and posterior segments. Left ventricular diastolic  parameters are consistent with Grade I diastolic dysfunction (impaired relaxation). There is mild hypokinesis of the left ventricular, basal-mid inferoseptal wall.  2. Right ventricular systolic function is normal. The right ventricular size is normal. Mildly increased right ventricular wall thickness. There is mildly elevated pulmonary artery systolic pressure.  3. Left atrial size was moderately dilated.  4. The mitral valve is degenerative. Mild mitral valve regurgitation.  5. The aortic valve is tricuspid. Aortic valve regurgitation is not visualized. Mild to moderate aortic valve sclerosis/calcification is present, without any evidence of aortic stenosis.  6. There is Moderate (Grade III) atheroma plaque involving the ascending aorta and aortic root.  7. The inferior vena cava is dilated in size with >50% respiratory variability, suggesting right atrial pressure of 8 mmHg. FINDINGS  Left Ventricle: Left ventricular ejection fraction, by estimation, is 55 to 60%. The left ventricle has low normal function. The left ventricle demonstrates regional wall motion abnormalities. Mild hypokinesis of the left ventricular, basal-mid inferoseptal wall. The left ventricular internal cavity size was normal in size. There is moderate concentric left ventricular hypertrophy of the anterior and posterior segments. Left ventricular diastolic parameters are consistent with Grade I diastolic  dysfunction (impaired relaxation). Right Ventricle: Prominent moderator band. The right ventricular size is normal. Mildly increased right ventricular wall thickness. Right ventricular systolic function is normal. There is mildly elevated pulmonary artery systolic pressure. Left Atrium: Left atrial size was moderately dilated. Right Atrium: Right atrial size was mildly dilated. Pericardium: There is no evidence of pericardial effusion. Mitral Valve: The mitral valve is degenerative in appearance. Mild mitral annular calcification. Mild  mitral valve regurgitation. MV peak gradient, 2.5 mmHg. The mean mitral valve gradient is 1.0 mmHg. Tricuspid Valve: The tricuspid valve is normal in structure. Tricuspid valve regurgitation is trivial. Aortic Valve: The aortic valve is tricuspid. . There is moderate thickening and moderate calcification of the aortic valve. Aortic valve regurgitation is not visualized. Mild to moderate aortic valve sclerosis/calcification is present, without any evidence of aortic stenosis. Moderate aortic valve annular calcification. There is moderate thickening of the aortic valve. There is moderate calcification of the aortic valve. Aortic valve mean gradient measures 3.0 mmHg. Aortic valve peak gradient measures 7.1 mmHg. Aortic valve area, by VTI measures 3.32 cm. Pulmonic  Valve: The pulmonic valve was normal in structure. Pulmonic valve regurgitation is not visualized. Aorta: The aortic root is normal in size and structure. There is moderate (Grade III) atheroma plaque involving the ascending aorta and aortic root. Venous: The inferior vena cava is dilated in size with greater than 50% respiratory variability, suggesting right atrial pressure of 8 mmHg. IAS/Shunts: No atrial level shunt detected by color flow Doppler. Additional Comments: There is no pleural effusion.  LEFT VENTRICLE PLAX 2D LVIDd:         3.55 cm LVIDs:         2.50 cm LV PW:         1.73 cm LV IVS:        1.35 cm LVOT diam:     2.20 cm LV SV:         73 LVOT Area:     3.80 cm  RIGHT VENTRICLE             IVC RV Basal diam:  2.20 cm     IVC diam: 2.21 cm RV S prime:     13.60 cm/s TAPSE (M-mode): 1.6 cm LEFT ATRIUM             RIGHT ATRIUM LA diam:        2.80 cm RA Area:     14.30 cm LA Vol (A2C):   71.2 ml RA Volume:   32.90 ml LA Vol (A4C):   50.5 ml LA Biplane Vol: 59.8 ml  AORTIC VALVE AV Area (Vmax):    3.40 cm AV Area (Vmean):   3.47 cm AV Area (VTI):     3.32 cm AV Vmax:           133.00 cm/s AV Vmean:          85.200 cm/s AV VTI:             0.221 m AV Peak Grad:      7.1 mmHg AV Mean Grad:      3.0 mmHg LVOT Vmax:         119.00 cm/s LVOT Vmean:        77.700 cm/s LVOT VTI:          0.193 m LVOT/AV VTI ratio: 0.87  AORTA Ao Root diam: 3.70 cm MITRAL VALVE MV Peak grad: 2.5 mmHg  SHUNTS MV Mean grad: 1.0 mmHg  Systemic VTI:  0.19 m MV Vmax:      0.80 m/s  Systemic Diam: 2.20 cm MV Vmean:     49.6 cm/s Dixie Dials MD Electronically signed by Dixie Dials MD Signature Date/Time: 03/11/2020/11:04:54 AM    Final     Assessment/Plan Active Problems:   Acute on chronic respiratory failure with hypoxia (HCC)   COVID-19 virus infection   Chronic atrial fibrillation with rapid ventricular response (HCC)   Chronic kidney disease, stage III (moderate)   Healthcare-associated pneumonia   1. Acute on chronic respiratory failure hypoxia we will continue with the NAG goal is 16 hours today 2. COVID-19 virus infection resolved 3. Chronic atrial fibrillation rate controlled 4. Chronic kidney disease stage III continue to monitor. 5. Healthcare associated p head neumonia last chest x-ray showing chronic interstitial changes no new acute changes are noted   I have personally seen and evaluated the patient, evaluated laboratory and imaging results, formulated the assessment and plan and placed orders. The Patient requires high complexity decision making with multiple systems involvement.  Rounds were done with the Respiratory Therapy Director and  Staff therapists and discussed with nursing staff also.  Allyne Gee, MD [facility] Pulmonary Critical Care Medicine Sleep Medicine

## 2020-03-13 LAB — BASIC METABOLIC PANEL
Anion gap: 11 (ref 5–15)
BUN: 63 mg/dL — ABNORMAL HIGH (ref 8–23)
CO2: 27 mmol/L (ref 22–32)
Calcium: 9.8 mg/dL (ref 8.9–10.3)
Chloride: 110 mmol/L (ref 98–111)
Creatinine, Ser: 1.3 mg/dL — ABNORMAL HIGH (ref 0.61–1.24)
GFR calc Af Amer: >60 mL/min (ref >60)
GFR calc non Af Amer: 54 mL/min — ABNORMAL LOW (ref >60)
Glucose, Bld: 102 mg/dL — ABNORMAL HIGH (ref 70–99)
Potassium: 3.9 mmol/L (ref 3.5–5.1)
Sodium: 148 mmol/L — ABNORMAL HIGH (ref 135–145)

## 2020-03-14 DIAGNOSIS — U071 COVID-19: Secondary | ICD-10-CM | POA: Diagnosis not present

## 2020-03-14 DIAGNOSIS — J9621 Acute and chronic respiratory failure with hypoxia: Secondary | ICD-10-CM | POA: Diagnosis not present

## 2020-03-14 DIAGNOSIS — I482 Chronic atrial fibrillation, unspecified: Secondary | ICD-10-CM | POA: Diagnosis not present

## 2020-03-14 DIAGNOSIS — N183 Chronic kidney disease, stage 3 unspecified: Secondary | ICD-10-CM | POA: Diagnosis not present

## 2020-03-14 NOTE — Progress Notes (Signed)
Pulmonary Critical Care Medicine [place]   PULMONARY CRITICAL CARE SERVICE  PROGRESS NOTE  Date of Service: 03/14/2020  Jovaun Levene  ONG:[id number]  DOB: Nov 06, 1946   DOA: 03/02/2020  Referring Physician: Merton Border, MD  HPI: Russell Rios is a 74 y.o. male seen for follow up of Acute on Chronic Respiratory Failure.  Patient currently is on the NAG 4 L of oxygen doing well yesterday was able to do 20 hours  Medications: Reviewed on Rounds  Physical Exam:  Vitals: Temperature 96.0 pulse 70 respiratory 18 blood pressure is 135/75 saturations 100%  Ventilator Settings on the NAG on 4 L of oxygen  . General: Comfortable at this time . Eyes: Grossly normal lids, irises & conjunctiva . ENT: grossly tongue is normal . Neck: no obvious mass . Cardiovascular: S1 S2 normal no gallop . Respiratory: No rhonchi no rales are noted at this time . Abdomen: soft . Skin: no rash seen on limited exam . Musculoskeletal: not rigid . Psychiatric:unable to assess . Neurologic: no seizure no involuntary movements         Lab Data:   Basic Metabolic Panel: Recent Labs  Lab 03/08/20 0708 03/08/20 1707 03/09/20 0555 03/09/20 1546 03/10/20 0618 03/11/20 0708 03/13/20 0728  NA 148*  --  149* 149* 149* 151* 148*  K 2.7*   < > 3.4* 3.7 3.5 3.6 3.9  CL 105  --  108 109 108 112* 110  CO2 27  --  24 29 29 29 27   GLUCOSE 139*  --  126* 143* 144* 138* 102*  BUN 129*  --  148* 146* 142* 96* 63*  CREATININE 2.49*  --  2.83* 2.75* 2.56* 1.74* 1.30*  CALCIUM 9.9  --  9.6 9.8 9.6 9.3 9.8  MG 2.9*  --   --   --   --   --   --    < > = values in this interval not displayed.    ABG: Recent Labs  Lab 03/09/20 1530  PHART 7.344*  PCO2ART 54.6*  PO2ART 52.8*  HCO3 29.1*  O2SAT 82.2    Liver Function Tests: No results for input(s): AST, ALT, ALKPHOS, BILITOT, PROT, ALBUMIN in the last 168 hours. No results for input(s): LIPASE, AMYLASE in the last 168 hours. No  results for input(s): AMMONIA in the last 168 hours.  CBC: Recent Labs  Lab 03/08/20 2350 03/08/20 2350 03/09/20 0555 03/09/20 1020 03/09/20 1546 03/10/20 0618 03/11/20 0708  WBC 10.3  --  11.6* 9.0 9.3 9.1  --   HGB 7.9*   < > 7.5* 7.3* 7.3* 7.1* 7.2*  HCT 27.8*   < > 25.5* 25.1* 25.2* 24.7* 24.3*  MCV 106.1*  --  104.5* 105.5* 104.6* 106.5*  --   PLT 318  --  344 342 364 351  --    < > = values in this interval not displayed.    Cardiac Enzymes: No results for input(s): CKTOTAL, CKMB, CKMBINDEX, TROPONINI in the last 168 hours.  BNP (last 3 results) No results for input(s): BNP in the last 8760 hours.  ProBNP (last 3 results) No results for input(s): PROBNP in the last 8760 hours.  Radiological Exams: No results found.  Assessment/Plan Active Problems:   Acute on chronic respiratory failure with hypoxia (HCC)   COVID-19 virus infection   Chronic atrial fibrillation with rapid ventricular response (HCC)   Chronic kidney disease, stage III (moderate)   Healthcare-associated pneumonia   1. Acute on chronic respiratory  failure with hypoxia we will continue with NAG on 4 L of oxygen continue secretion management pulmonary toilet. 2. COVID-19 virus infection treated in resolution phase 3. Chronic atrial fibrillation rate controlled 4. 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 Healthcare associated pneumonia treated clinically improved   I have personally seen and evaluated the patient, evaluated laboratory and imaging results, formulated the assessment and plan and placed orders. The Patient requires high complexity decision making with multiple systems involvement.  Rounds were done with the Respiratory Therapy Director and Staff therapists and discussed with nursing staff also.  Yevonne Pax, MD [facility] Pulmonary Critical Care Medicine Sleep Medicine

## 2020-03-15 DIAGNOSIS — U071 COVID-19: Secondary | ICD-10-CM | POA: Diagnosis not present

## 2020-03-15 DIAGNOSIS — I482 Chronic atrial fibrillation, unspecified: Secondary | ICD-10-CM | POA: Diagnosis not present

## 2020-03-15 DIAGNOSIS — N183 Chronic kidney disease, stage 3 unspecified: Secondary | ICD-10-CM | POA: Diagnosis not present

## 2020-03-15 DIAGNOSIS — J9621 Acute and chronic respiratory failure with hypoxia: Secondary | ICD-10-CM | POA: Diagnosis not present

## 2020-03-15 NOTE — Progress Notes (Signed)
Pulmonary Critical Care Medicine [facility] [place]   PULMONARY CRITICAL CARE SERVICE  PROGRESS NOTE  Date of Service: 03/15/2020  Russell Rios  DDU:[id number]  DOB: January 08, 1946   DOA: 03/02/2020  Referring Physician: Carron Curie, MD  HPI: Russell Rios is a 74 y.o. male seen for follow up of Acute on Chronic Respiratory Failure.  Patient currently is off the ventilator on the NAG requiring 4 L of oxygen  Medications: Reviewed on Rounds  Physical Exam:  Vitals: Temperature is 96.8 pulse 73 respiratory rate 22 blood pressure is 107/48 saturations 100%  Ventilator Settings off the ventilator right now on the NAG has been on 4 L goal is for 24 hours  . General: Comfortable at this time . Eyes: Grossly normal lids, irises & conjunctiva . ENT: grossly tongue is normal . Neck: no obvious mass . Cardiovascular: S1 S2 normal no gallop . Respiratory: Coarse rhonchi expansion is equal . Abdomen: soft . Skin: no rash seen on limited exam . Musculoskeletal: not rigid . Psychiatric:unable to assess . Neurologic: no seizure no involuntary movements         Lab Data:   Basic Metabolic Panel: Recent Labs  Lab 03/09/20 0555 03/09/20 1546 03/10/20 0618 03/11/20 0708 03/13/20 0728  NA 149* 149* 149* 151* 148*  K 3.4* 3.7 3.5 3.6 3.9  CL 108 109 108 112* 110  CO2 24 29 29 29 27   GLUCOSE 126* 143* 144* 138* 102*  BUN 148* 146* 142* 96* 63*  CREATININE 2.83* 2.75* 2.56* 1.74* 1.30*  CALCIUM 9.6 9.8 9.6 9.3 9.8    ABG: Recent Labs  Lab 03/09/20 1530  PHART 7.344*  PCO2ART 54.6*  PO2ART 52.8*  HCO3 29.1*  O2SAT 82.2    Liver Function Tests: No results for input(s): AST, ALT, ALKPHOS, BILITOT, PROT, ALBUMIN in the last 168 hours. No results for input(s): LIPASE, AMYLASE in the last 168 hours. No results for input(s): AMMONIA in the last 168 hours.  CBC: Recent Labs  Lab 03/08/20 2350 03/08/20 2350 03/09/20 0555 03/09/20 1020 03/09/20 1546 03/10/20 0618  03/11/20 0708  WBC 10.3  --  11.6* 9.0 9.3 9.1  --   HGB 7.9*   < > 7.5* 7.3* 7.3* 7.1* 7.2*  HCT 27.8*   < > 25.5* 25.1* 25.2* 24.7* 24.3*  MCV 106.1*  --  104.5* 105.5* 104.6* 106.5*  --   PLT 318  --  344 342 364 351  --    < > = values in this interval not displayed.    Cardiac Enzymes: No results for input(s): CKTOTAL, CKMB, CKMBINDEX, TROPONINI in the last 168 hours.  BNP (last 3 results) No results for input(s): BNP in the last 8760 hours.  ProBNP (last 3 results) No results for input(s): PROBNP in the last 8760 hours.  Radiological Exams: No results found.  Assessment/Plan Active Problems:   Acute on chronic respiratory failure with hypoxia (HCC)   COVID-19 virus infection   Chronic atrial fibrillation with rapid ventricular response (HCC)   Chronic kidney disease, stage III (moderate)   Healthcare-associated pneumonia   1. Acute on chronic respiratory failure hypoxia patient is off the ventilator on the NAG as mentioned goal is for 24 hours. 2. COVID-19 virus infection in resolution phase 3. Chronic atrial fibrillation rate controlled at this time. 4. Chronic kidney disease stage III no change 5. Healthcare associated pneumonia at baseline we will continue to follow along   I have personally seen and evaluated the patient, evaluated laboratory and  imaging results, formulated the assessment and plan and placed orders. The Patient requires high complexity decision making with multiple systems involvement.  Rounds were done with the Respiratory Therapy Director and Staff therapists and discussed with nursing staff also.  Allyne Gee, MD [facility] Pulmonary Critical Care Medicine Sleep Medicine

## 2020-03-16 DIAGNOSIS — U071 COVID-19: Secondary | ICD-10-CM | POA: Diagnosis not present

## 2020-03-16 DIAGNOSIS — N183 Chronic kidney disease, stage 3 unspecified: Secondary | ICD-10-CM | POA: Diagnosis not present

## 2020-03-16 DIAGNOSIS — J9621 Acute and chronic respiratory failure with hypoxia: Secondary | ICD-10-CM | POA: Diagnosis not present

## 2020-03-16 DIAGNOSIS — I482 Chronic atrial fibrillation, unspecified: Secondary | ICD-10-CM | POA: Diagnosis not present

## 2020-03-16 LAB — BASIC METABOLIC PANEL
Anion gap: 7 (ref 5–15)
BUN: 44 mg/dL — ABNORMAL HIGH (ref 8–23)
CO2: 34 mmol/L — ABNORMAL HIGH (ref 22–32)
Calcium: 9.8 mg/dL (ref 8.9–10.3)
Chloride: 109 mmol/L (ref 98–111)
Creatinine, Ser: 1.18 mg/dL (ref 0.61–1.24)
GFR calc Af Amer: >60 mL/min (ref >60)
GFR calc non Af Amer: >60 mL/min (ref >60)
Glucose, Bld: 132 mg/dL — ABNORMAL HIGH (ref 70–99)
Potassium: 3.5 mmol/L (ref 3.5–5.1)
Sodium: 150 mmol/L — ABNORMAL HIGH (ref 135–145)

## 2020-03-16 LAB — CBC
HCT: 24.1 % — ABNORMAL LOW (ref 39.0–52.0)
Hemoglobin: 6.8 g/dL — CL (ref 13.0–17.0)
MCH: 30 pg (ref 26.0–34.0)
MCHC: 28.2 g/dL — ABNORMAL LOW (ref 30.0–36.0)
MCV: 106.2 fL — ABNORMAL HIGH (ref 80.0–100.0)
Platelets: 234 10*3/uL (ref 150–400)
RBC: 2.27 MIL/uL — ABNORMAL LOW (ref 4.22–5.81)
RDW: 22.1 % — ABNORMAL HIGH (ref 11.5–15.5)
WBC: 6.7 10*3/uL (ref 4.0–10.5)
nRBC: 0.7 % — ABNORMAL HIGH (ref 0.0–0.2)

## 2020-03-16 LAB — PREPARE RBC (CROSSMATCH)

## 2020-03-16 NOTE — Progress Notes (Signed)
Ref: No primary care provider on file.   Subjective:  Awake. VS stable.   Objective:  Vital Signs in the last 24 hours: BP: 108/44, P: 80, R: 27, O2 sat 98 %    Physical Exam: BP Readings from Last 1 Encounters:  No data found for BP     Wt Readings from Last 1 Encounters:  No data found for Wt    Weight change:  There is no height or weight on file to calculate BMI. HEENT: [place]/AT, Eyes-Brown, Conjunctiva-Pale, Sclera-Non-icteric Neck: No JVD, No bruit, Trachea midline. Lungs:  Clearing, Bilateral. Cardiac:  Regular rhythm, normal S1 and S2, no S3. III/VI systolic murmur. Abdomen:  Soft, non-tender. BS present. Extremities:  No edema present. No cyanosis. No clubbing. CNS: AxOx2, Cranial nerves grossly intact, moves all 4 extremities.  Skin: Warm and dry.   Intake/Output from previous day: No intake/output data recorded.    Lab Results: BMET    Component Value Date/Time   NA 150 (H) 03/16/2020 0540   NA 148 (H) 03/13/2020 0728   NA 151 (H) 03/11/2020 0708   K 3.5 03/16/2020 0540   K 3.9 03/13/2020 0728   K 3.6 03/11/2020 0708   CL 109 03/16/2020 0540   CL 110 03/13/2020 0728   CL 112 (H) 03/11/2020 0708   CO2 34 (H) 03/16/2020 0540   CO2 27 03/13/2020 0728   CO2 29 03/11/2020 0708   GLUCOSE 132 (H) 03/16/2020 0540   GLUCOSE 102 (H) 03/13/2020 0728   GLUCOSE 138 (H) 03/11/2020 0708   BUN 44 (H) 03/16/2020 0540   BUN 63 (H) 03/13/2020 0728   BUN 96 (H) 03/11/2020 0708   CREATININE 1.18 03/16/2020 0540   CREATININE 1.30 (H) 03/13/2020 0728   CREATININE 1.74 (H) 03/11/2020 0708   CALCIUM 9.8 03/16/2020 0540   CALCIUM 9.8 03/13/2020 0728   CALCIUM 9.3 03/11/2020 0708   GFRNONAA >60 03/16/2020 0540   GFRNONAA 54 (L) 03/13/2020 0728   GFRNONAA 38 (L) 03/11/2020 0708   GFRAA >60 03/16/2020 0540   GFRAA >60 03/13/2020 0728   GFRAA 44 (L) 03/11/2020 0708   CBC    Component Value Date/Time   WBC 6.7 03/16/2020 0540   RBC 2.27 (L) 03/16/2020 0540   HGB 6.8  (LL) 03/16/2020 0540   HCT 24.1 (L) 03/16/2020 0540   PLT 234 03/16/2020 0540   MCV 106.2 (H) 03/16/2020 0540   MCH 30.0 03/16/2020 0540   MCHC 28.2 (L) 03/16/2020 0540   RDW 22.1 (H) 03/16/2020 0540   LYMPHSABS 1.5 03/03/2020 0641   MONOABS 0.6 03/03/2020 0641   EOSABS 0.1 03/03/2020 0641   BASOSABS 0.0 03/03/2020 0641   HEPATIC Function Panel Recent Labs    03/03/20 0641  PROT 7.8   HEMOGLOBIN A1C No components found for: HGA1C,  MPG CARDIAC ENZYMES No results found for: CKTOTAL, CKMB, CKMBINDEX, TROPONINI BNP No results for input(s): PROBNP in the last 8760 hours. TSH No results for input(s): TSH in the last 8760 hours. CHOLESTEROL No results for input(s): CHOL in the last 8760 hours.  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   LOS: 0 days   Time spent including chart review, lab review, examination, discussion with patient : 20 min   Orpah Cobb  MD  03/16/2020, 2:15 PM

## 2020-03-16 NOTE — Progress Notes (Signed)
Pulmonary Critical Care Medicine [facility] [place]   PULMONARY CRITICAL CARE SERVICE  PROGRESS NOTE  Date of Service: 03/16/2020  Russell Rios  [id number]:[id number]  DOB: [birth year]   DOA: 03/02/2020  Referring Physician: Carron Curie, MD  HPI: Russell Rios is a 74 y.o. male seen for follow up of Acute on Chronic Respiratory Failure.  Patient at this time is on pressure support 8/5 actually doing well.  Apparently overnight was placed on the ventilator because of desaturations now actually appears to be doing well  Medications: Reviewed on Rounds  Physical Exam:  Vitals: Temperature is 96.7 pulse 78 respiratory 27 blood pressure is 108/44 saturations 96%  Ventilator Settings on pressure support FiO2 40%  . General: Comfortable at this time . Eyes: Grossly normal lids, irises & conjunctiva . ENT: grossly tongue is normal . Neck: no obvious mass . Cardiovascular: S1 S2 normal no gallop . Respiratory: No rhonchi coarse breath sounds . Abdomen: soft . Skin: no rash seen on limited exam . Musculoskeletal: not rigid . Psychiatric:unable to assess . Neurologic: no seizure no involuntary movements         Lab Data:   Basic Metabolic Panel: Recent Labs  Lab 03/09/20 1546 03/10/20 0618 03/11/20 0708 03/13/20 0728 03/16/20 0540  NA 149* 149* 151* 148* 150*  K 3.7 3.5 3.6 3.9 3.5  CL 109 108 112* 110 109  CO2 29 29 29 27  34*  GLUCOSE 143* 144* 138* 102* 132*  BUN 146* 142* 96* 63* 44*  CREATININE 2.75* 2.56* 1.74* 1.30* 1.18  CALCIUM 9.8 9.6 9.3 9.8 9.8    ABG: Recent Labs  Lab 03/09/20 1530  PHART 7.344*  PCO2ART 54.6*  PO2ART 52.8*  HCO3 29.1*  O2SAT 82.2    Liver Function Tests: No results for input(s): AST, ALT, ALKPHOS, BILITOT, PROT, ALBUMIN in the last 168 hours. No results for input(s): LIPASE, AMYLASE in the last 168 hours. No results for input(s): AMMONIA in the last 168 hours.  CBC: Recent Labs  Lab 03/09/20 1546 03/10/20 0618  03/11/20 0708 03/16/20 0540  WBC 9.3 9.1  --  6.7  HGB 7.3* 7.1* 7.2* 6.8*  HCT 25.2* 24.7* 24.3* 24.1*  MCV 104.6* 106.5*  --  106.2*  PLT 364 351  --  234    Cardiac Enzymes: No results for input(s): CKTOTAL, CKMB, CKMBINDEX, TROPONINI in the last 168 hours.  BNP (last 3 results) No results for input(s): BNP in the last 8760 hours.  ProBNP (last 3 results) No results for input(s): PROBNP in the last 8760 hours.  Radiological Exams: No results found.  Assessment/Plan Active Problems:   Acute on chronic respiratory failure with hypoxia (HCC)   COVID-19 virus infection   Chronic atrial fibrillation with rapid ventricular response (HCC)   Chronic kidney disease, stage III (moderate)   Healthcare-associated pneumonia   1. Acute on chronic respiratory failure hypoxia plan is to continue with pressure support mode and advance to T collar again on the NAG 2. COVID-19 virus infection resolved we will continue with supportive care 3. Chronic atrial fibrillation rate controlled 4. Chronic kidney disease stage III supportive care 5. Healthcare associated pneumonia treated we will continue to follow   I have personally seen and evaluated the patient, evaluated laboratory and imaging results, formulated the assessment and plan and placed orders. The Patient requires high complexity decision making with multiple systems involvement.  Rounds were done with the Respiratory Therapy Director and Staff therapists and discussed with nursing staff also.  Syan Cullimore  Richardson Dopp, MD [facility] Pulmonary Critical Care Medicine Sleep Medicine

## 2020-03-17 DIAGNOSIS — U071 COVID-19: Secondary | ICD-10-CM | POA: Diagnosis not present

## 2020-03-17 DIAGNOSIS — J9621 Acute and chronic respiratory failure with hypoxia: Secondary | ICD-10-CM | POA: Diagnosis not present

## 2020-03-17 DIAGNOSIS — I482 Chronic atrial fibrillation, unspecified: Secondary | ICD-10-CM | POA: Diagnosis not present

## 2020-03-17 DIAGNOSIS — N183 Chronic kidney disease, stage 3 unspecified: Secondary | ICD-10-CM | POA: Diagnosis not present

## 2020-03-17 LAB — BASIC METABOLIC PANEL
Anion gap: 10 (ref 5–15)
BUN: 35 mg/dL — ABNORMAL HIGH (ref 8–23)
CO2: 33 mmol/L — ABNORMAL HIGH (ref 22–32)
Calcium: 9.4 mg/dL (ref 8.9–10.3)
Chloride: 106 mmol/L (ref 98–111)
Creatinine, Ser: 1.11 mg/dL (ref 0.61–1.24)
GFR calc Af Amer: >60 mL/min (ref >60)
GFR calc non Af Amer: >60 mL/min (ref >60)
Glucose, Bld: 140 mg/dL — ABNORMAL HIGH (ref 70–99)
Potassium: 3.6 mmol/L (ref 3.5–5.1)
Sodium: 149 mmol/L — ABNORMAL HIGH (ref 135–145)

## 2020-03-17 LAB — TYPE AND SCREEN
ABO/RH(D): O POS
Antibody Screen: NEGATIVE
Unit division: 0

## 2020-03-17 LAB — HEMOGLOBIN AND HEMATOCRIT, BLOOD
HCT: 28.9 % — ABNORMAL LOW (ref 39.0–52.0)
Hemoglobin: 8.3 g/dL — ABNORMAL LOW (ref 13.0–17.0)

## 2020-03-17 LAB — BPAM RBC
Blood Product Expiration Date: 202105182359
ISSUE DATE / TIME: 202104271456
Unit Type and Rh: 5100

## 2020-03-17 NOTE — Progress Notes (Signed)
Pulmonary Critical Care Medicine [facility] [place]   PULMONARY CRITICAL CARE SERVICE  PROGRESS NOTE  Date of Service: 03/17/2020  Joram Venson  XAJ:[id number]  DOB: 22-Jan-1946   DOA: 03/02/2020  Referring Physician: Carron Curie, MD  HPI: Collis Thede is a 74 y.o. male seen for follow up of Acute on Chronic Respiratory Failure.  Currently on the NAG has been on 4 L today will be 24 hours  Medications: Reviewed on Rounds  Physical Exam:  Vitals: Temperature is 97.1 pulse 86 respiratory 25 blood pressure is 112/58 saturations 100%  Ventilator Settings off the ventilator on the NAG on 4 L of oxygen  . General: Comfortable at this time . Eyes: Grossly normal lids, irises & conjunctiva . ENT: grossly tongue is normal . Neck: no obvious mass . Cardiovascular: S1 S2 normal no gallop . Respiratory: No rhonchi no rales are noted at this time . Abdomen: soft . Skin: no rash seen on limited exam . Musculoskeletal: not rigid . Psychiatric:unable to assess . Neurologic: no seizure no involuntary movements         Lab Data:   Basic Metabolic Panel: Recent Labs  Lab 03/11/20 0708 03/13/20 0728 03/16/20 0540 03/17/20 0633  NA 151* 148* 150* 149*  K 3.6 3.9 3.5 3.6  CL 112* 110 109 106  CO2 29 27 34* 33*  GLUCOSE 138* 102* 132* 140*  BUN 96* 63* 44* 35*  CREATININE 1.74* 1.30* 1.18 1.11  CALCIUM 9.3 9.8 9.8 9.4    ABG: No results for input(s): PHART, PCO2ART, PO2ART, HCO3, O2SAT in the last 168 hours.  Liver Function Tests: No results for input(s): AST, ALT, ALKPHOS, BILITOT, PROT, ALBUMIN in the last 168 hours. No results for input(s): LIPASE, AMYLASE in the last 168 hours. No results for input(s): AMMONIA in the last 168 hours.  CBC: Recent Labs  Lab 03/11/20 0708 03/16/20 0540 03/17/20 0633  WBC  --  6.7  --   HGB 7.2* 6.8* 8.3*  HCT 24.3* 24.1* 28.9*  MCV  --  106.2*  --   PLT  --  234  --     Cardiac Enzymes: No results for input(s):  CKTOTAL, CKMB, CKMBINDEX, TROPONINI in the last 168 hours.  BNP (last 3 results) No results for input(s): BNP in the last 8760 hours.  ProBNP (last 3 results) No results for input(s): PROBNP in the last 8760 hours.  Radiological Exams: No results found.  Assessment/Plan Active Problems:   Acute on chronic respiratory failure with hypoxia (HCC)   COVID-19 virus infection   Chronic atrial fibrillation with rapid ventricular response (HCC)   Chronic kidney disease, stage III (moderate)   Healthcare-associated pneumonia   1. Acute on chronic respiratory failure hypoxia plan is to continue with the NAG titrate oxygen down as tolerated 2. COVID-19 virus infection resolved 3. Chronic atrial fibrillation rate controlled at this time 4. Chronic kidney disease stage III at baseline we will continue to follow along. 5. Healthcare associated pneumonia treated   I have personally seen and evaluated the patient, evaluated laboratory and imaging results, formulated the assessment and plan and placed orders. The Patient requires high complexity decision making with multiple systems involvement.  Rounds were done with the Respiratory Therapy Director and Staff therapists and discussed with nursing staff also.  Yevonne Pax, MD [facility] Pulmonary Critical Care Medicine Sleep Medicine

## 2020-03-18 ENCOUNTER — Other Ambulatory Visit (HOSPITAL_COMMUNITY): Payer: Medicare Other

## 2020-03-18 DIAGNOSIS — I482 Chronic atrial fibrillation, unspecified: Secondary | ICD-10-CM | POA: Diagnosis not present

## 2020-03-18 DIAGNOSIS — U071 COVID-19: Secondary | ICD-10-CM | POA: Diagnosis not present

## 2020-03-18 DIAGNOSIS — J9621 Acute and chronic respiratory failure with hypoxia: Secondary | ICD-10-CM | POA: Diagnosis not present

## 2020-03-18 DIAGNOSIS — N183 Chronic kidney disease, stage 3 unspecified: Secondary | ICD-10-CM | POA: Diagnosis not present

## 2020-03-18 LAB — BASIC METABOLIC PANEL
Anion gap: 8 (ref 5–15)
BUN: 37 mg/dL — ABNORMAL HIGH (ref 8–23)
CO2: 32 mmol/L (ref 22–32)
Calcium: 9.2 mg/dL (ref 8.9–10.3)
Chloride: 106 mmol/L (ref 98–111)
Creatinine, Ser: 1.22 mg/dL (ref 0.61–1.24)
GFR calc Af Amer: >60 mL/min (ref >60)
GFR calc non Af Amer: 58 mL/min — ABNORMAL LOW (ref >60)
Glucose, Bld: 126 mg/dL — ABNORMAL HIGH (ref 70–99)
Potassium: 3.4 mmol/L — ABNORMAL LOW (ref 3.5–5.1)
Sodium: 146 mmol/L — ABNORMAL HIGH (ref 135–145)

## 2020-03-18 LAB — CBC
HCT: 26.8 % — ABNORMAL LOW (ref 39.0–52.0)
Hemoglobin: 7.7 g/dL — ABNORMAL LOW (ref 13.0–17.0)
MCH: 28.9 pg (ref 26.0–34.0)
MCHC: 28.7 g/dL — ABNORMAL LOW (ref 30.0–36.0)
MCV: 100.8 fL — ABNORMAL HIGH (ref 80.0–100.0)
Platelets: 204 10*3/uL (ref 150–400)
RBC: 2.66 MIL/uL — ABNORMAL LOW (ref 4.22–5.81)
RDW: 22.5 % — ABNORMAL HIGH (ref 11.5–15.5)
WBC: 8.7 10*3/uL (ref 4.0–10.5)
nRBC: 0.7 % — ABNORMAL HIGH (ref 0.0–0.2)

## 2020-03-18 LAB — BLOOD GAS, ARTERIAL
Acid-Base Excess: 8.7 mmol/L — ABNORMAL HIGH (ref 0.0–2.0)
Bicarbonate: 34.1 mmol/L — ABNORMAL HIGH (ref 20.0–28.0)
FIO2: 32
O2 Saturation: 88 %
Patient temperature: 36.9
pCO2 arterial: 60.7 mmHg — ABNORMAL HIGH (ref 32.0–48.0)
pH, Arterial: 7.368 (ref 7.350–7.450)
pO2, Arterial: 56.5 mmHg — ABNORMAL LOW (ref 83.0–108.0)

## 2020-03-18 NOTE — Progress Notes (Signed)
Pulmonary Critical Care Medicine [place]   PULMONARY CRITICAL CARE SERVICE  PROGRESS NOTE  Date of Service: 03/18/2020  Russell Rios  YIF:[id number]  DOB: 11/22/1945   DOA: 03/02/2020  Referring Physician: Merton Border, MD  HPI: Russell Rios is a 74 y.o. male seen for follow up of Acute on Chronic Respiratory Failure.  Currently is on T collar has been on 40% FiO2 doing fairly well.  Medications: Reviewed on Rounds  Physical Exam:  Vitals: Temperature 98.9 pulse 81 respiratory 24 blood pressure is 124/53 saturations 99%  Ventilator Settings off ventilator on T collar on 40% FiO2  . General: Comfortable at this time . Eyes: Grossly normal lids, irises & conjunctiva . ENT: grossly tongue is normal . Neck: no obvious mass . Cardiovascular: S1 S2 normal no gallop . Respiratory: No rhonchi coarse breath sounds . Abdomen: soft . Skin: no rash seen on limited exam . Musculoskeletal: not rigid . Psychiatric:unable to assess . Neurologic: no seizure no involuntary movements         Lab Data:   Basic Metabolic Panel: Recent Labs  Lab 03/13/20 0728 03/16/20 0540 03/17/20 0633 03/18/20 0355  NA 148* 150* 149* 146*  K 3.9 3.5 3.6 3.4*  CL 110 109 106 106  CO2 27 34* 33* 32  GLUCOSE 102* 132* 140* 126*  BUN 63* 44* 35* 37*  CREATININE 1.30* 1.18 1.11 1.22  CALCIUM 9.8 9.8 9.4 9.2    ABG: Recent Labs  Lab 03/18/20 0934  PHART 7.368  PCO2ART 60.7*  PO2ART 56.5*  HCO3 34.1*  O2SAT 88.0    Liver Function Tests: No results for input(s): AST, ALT, ALKPHOS, BILITOT, PROT, ALBUMIN in the last 168 hours. No results for input(s): LIPASE, AMYLASE in the last 168 hours. No results for input(s): AMMONIA in the last 168 hours.  CBC: Recent Labs  Lab 03/16/20 0540 03/17/20 0633 03/18/20 0355  WBC 6.7  --  8.7  HGB 6.8* 8.3* 7.7*  HCT 24.1* 28.9* 26.8*  MCV 106.2*  --  100.8*  PLT 234  --  204    Cardiac Enzymes: No results for input(s):  CKTOTAL, CKMB, CKMBINDEX, TROPONINI in the last 168 hours.  BNP (last 3 results) No results for input(s): BNP in the last 8760 hours.  ProBNP (last 3 results) No results for input(s): PROBNP in the last 8760 hours.  Radiological Exams: DG Chest Port 1 View  Result Date: 03/18/2020 CLINICAL DATA:  Hypoxia. EXAM: PORTABLE CHEST 1 VIEW COMPARISON:  March 09, 2020. FINDINGS: Stable cardiomediastinal silhouette. Tracheostomy tube is in good position. No pneumothorax or pleural effusion is noted. Stable bilateral interstitial densities are noted throughout both lungs most consistent with scarring or fibrosis, although acute superimposed edema or inflammation cannot be excluded. Bony thorax is unremarkable. IMPRESSION: Stable bilateral interstitial densities are noted throughout both lungs most consistent with scarring or fibrosis, although acute superimposed edema or inflammation cannot be excluded. Electronically Signed   By: Marijo Conception M.D.   On: 03/18/2020 12:23    Assessment/Plan Active Problems:   Acute on chronic respiratory failure with hypoxia (HCC)   COVID-19 virus infection   Chronic atrial fibrillation with rapid ventricular response (HCC)   Chronic kidney disease, stage III (moderate)   Healthcare-associated pneumonia   1. Acute on chronic respiratory failure hypoxia we will continue with T collar trials are now on 40% oxygen continue pulmonary toilet supportive care 2. COVID-19 virus infection in resolution phase 3. Chronic atrial fibrillation rate controlled 4.  Chronic kidney disease stage III monitoring labs 5. Healthcare associated pneumonia treated clinically appears to be improving still has some issues with fibrosis noted on the chest film   I have personally seen and evaluated the patient, evaluated laboratory and imaging results, formulated the assessment and plan and placed orders. The Patient requires high complexity decision making with multiple systems  involvement.  Rounds were done with the Respiratory Therapy Director and Staff therapists and discussed with nursing staff also.  Yevonne Pax, MD [facility] Pulmonary Critical Care Medicine Sleep Medicine

## 2020-03-19 DIAGNOSIS — J9621 Acute and chronic respiratory failure with hypoxia: Secondary | ICD-10-CM | POA: Diagnosis not present

## 2020-03-19 DIAGNOSIS — U071 COVID-19: Secondary | ICD-10-CM | POA: Diagnosis not present

## 2020-03-19 DIAGNOSIS — N183 Chronic kidney disease, stage 3 unspecified: Secondary | ICD-10-CM | POA: Diagnosis not present

## 2020-03-19 DIAGNOSIS — I482 Chronic atrial fibrillation, unspecified: Secondary | ICD-10-CM | POA: Diagnosis not present

## 2020-03-19 LAB — BASIC METABOLIC PANEL
Anion gap: 8 (ref 5–15)
BUN: 35 mg/dL — ABNORMAL HIGH (ref 8–23)
CO2: 31 mmol/L (ref 22–32)
Calcium: 9.1 mg/dL (ref 8.9–10.3)
Chloride: 103 mmol/L (ref 98–111)
Creatinine, Ser: 1.16 mg/dL (ref 0.61–1.24)
GFR calc Af Amer: >60 mL/min (ref >60)
GFR calc non Af Amer: >60 mL/min (ref >60)
Glucose, Bld: 128 mg/dL — ABNORMAL HIGH (ref 70–99)
Potassium: 3.7 mmol/L (ref 3.5–5.1)
Sodium: 142 mmol/L (ref 135–145)

## 2020-03-19 LAB — CBC
HCT: 27.7 % — ABNORMAL LOW (ref 39.0–52.0)
Hemoglobin: 8.2 g/dL — ABNORMAL LOW (ref 13.0–17.0)
MCH: 29.5 pg (ref 26.0–34.0)
MCHC: 29.6 g/dL — ABNORMAL LOW (ref 30.0–36.0)
MCV: 99.6 fL (ref 80.0–100.0)
Platelets: 196 10*3/uL (ref 150–400)
RBC: 2.78 MIL/uL — ABNORMAL LOW (ref 4.22–5.81)
RDW: 21.4 % — ABNORMAL HIGH (ref 11.5–15.5)
WBC: 9.9 10*3/uL (ref 4.0–10.5)
nRBC: 0.4 % — ABNORMAL HIGH (ref 0.0–0.2)

## 2020-03-19 NOTE — Progress Notes (Signed)
Pulmonary Critical Care Medicine [facility] [place]   PULMONARY CRITICAL CARE SERVICE  PROGRESS NOTE  Date of Service: 03/19/2020  Russell Rios  CBJ:[id number]  DOB: [birth year]   DOA: 03/02/2020  Referring Physician: Carron Curie, MD  HPI: Russell Rios is a 74 y.o. male seen for follow up of Acute on Chronic Respiratory Failure.  Patient currently is on T collar has been on 60% FiO2 with good saturations at this time  Medications: Reviewed on Rounds  Physical Exam:  Vitals: Temperature is 100.3 pulse 88 respiratory 23 blood pressure is 139/69 saturations 98%  Ventilator Settings on 60% FiO2 on T collar  . General: Comfortable at this time . Eyes: Grossly normal lids, irises & conjunctiva . ENT: grossly tongue is normal . Neck: no obvious mass . Cardiovascular: S1 S2 normal no gallop . Respiratory: No rhonchi coarse breath sounds are noted . Abdomen: soft . Skin: no rash seen on limited exam . Musculoskeletal: not rigid . Psychiatric:unable to assess . Neurologic: no seizure no involuntary movements         Lab Data:   Basic Metabolic Panel: Recent Labs  Lab 03/13/20 0728 03/16/20 0540 03/17/20 0633 03/18/20 0355 03/19/20 0542  NA 148* 150* 149* 146* 142  K 3.9 3.5 3.6 3.4* 3.7  CL 110 109 106 106 103  CO2 27 34* 33* 32 31  GLUCOSE 102* 132* 140* 126* 128*  BUN 63* 44* 35* 37* 35*  CREATININE 1.30* 1.18 1.11 1.22 1.16  CALCIUM 9.8 9.8 9.4 9.2 9.1    ABG: Recent Labs  Lab 03/18/20 0934  PHART 7.368  PCO2ART 60.7*  PO2ART 56.5*  HCO3 34.1*  O2SAT 88.0    Liver Function Tests: No results for input(s): AST, ALT, ALKPHOS, BILITOT, PROT, ALBUMIN in the last 168 hours. No results for input(s): LIPASE, AMYLASE in the last 168 hours. No results for input(s): AMMONIA in the last 168 hours.  CBC: Recent Labs  Lab 03/16/20 0540 03/17/20 0633 03/18/20 0355 03/19/20 0542  WBC 6.7  --  8.7 9.9  HGB 6.8* 8.3* 7.7* 8.2*  HCT 24.1* 28.9* 26.8*  27.7*  MCV 106.2*  --  100.8* 99.6  PLT 234  --  204 196    Cardiac Enzymes: No results for input(s): CKTOTAL, CKMB, CKMBINDEX, TROPONINI in the last 168 hours.  BNP (last 3 results) No results for input(s): BNP in the last 8760 hours.  ProBNP (last 3 results) No results for input(s): PROBNP in the last 8760 hours.  Radiological Exams: DG Chest Port 1 View  Result Date: 03/18/2020 CLINICAL DATA:  Hypoxia. EXAM: PORTABLE CHEST 1 VIEW COMPARISON:  March 09, 2020. FINDINGS: Stable cardiomediastinal silhouette. Tracheostomy tube is in good position. No pneumothorax or pleural effusion is noted. Stable bilateral interstitial densities are noted throughout both lungs most consistent with scarring or fibrosis, although acute superimposed edema or inflammation cannot be excluded. Bony thorax is unremarkable. IMPRESSION: Stable bilateral interstitial densities are noted throughout both lungs most consistent with scarring or fibrosis, although acute superimposed edema or inflammation cannot be excluded. Electronically Signed   By: Lupita Raider M.D.   On: 03/18/2020 12:23    Assessment/Plan Active Problems:   Acute on chronic respiratory failure with hypoxia (HCC)   COVID-19 virus infection   Chronic atrial fibrillation with rapid ventricular response (HCC)   Chronic kidney disease, stage III (moderate)   Healthcare-associated pneumonia   1. Acute on chronic respiratory failure hypoxia we will continue with T collar trials currently on  60% FiO2. 2. COVID-19 virus infection treated clinically improving 3. Chronic atrial fibrillation rate is controlled 4. Chronic kidney disease stage III we will continue to follow 5. Healthcare associated pneumonia treated showing some improvement we will monitor closely   I have personally seen and evaluated the patient, evaluated laboratory and imaging results, formulated the assessment and plan and placed orders. The Patient requires high complexity  decision making with multiple systems involvement.  Rounds were done with the Respiratory Therapy Director and Staff therapists and discussed with nursing staff also.  Allyne Gee, MD [facility] Pulmonary Critical Care Medicine Sleep Medicine

## 2020-03-20 DIAGNOSIS — I482 Chronic atrial fibrillation, unspecified: Secondary | ICD-10-CM | POA: Diagnosis not present

## 2020-03-20 DIAGNOSIS — J9621 Acute and chronic respiratory failure with hypoxia: Secondary | ICD-10-CM | POA: Diagnosis not present

## 2020-03-20 DIAGNOSIS — N183 Chronic kidney disease, stage 3 unspecified: Secondary | ICD-10-CM | POA: Diagnosis not present

## 2020-03-20 DIAGNOSIS — U071 COVID-19: Secondary | ICD-10-CM | POA: Diagnosis not present

## 2020-03-20 NOTE — Progress Notes (Signed)
Pulmonary Critical Care Medicine [facility] [place]   PULMONARY CRITICAL CARE SERVICE  PROGRESS NOTE  Date of Service: 03/20/2020  Russell Rios  [id number]  DOB: 12-29-1945   DOA: 03/02/2020  Referring Physician: Carron Curie, MD  HPI: Russell Rios is a 74 y.o. male seen for follow up of Acute on Chronic Respiratory Failure.  Patient is resting comfortably has been on T collar 50% FiO2 to a goal of the 24 hours.  Had been placed on the ventilator temporarily overnight  Medications: Reviewed on Rounds  Physical Exam:  Vitals: Temperature is 101.0 pulse 84 respiratory 29 blood pressure is 126/71 saturations 96%  Ventilator Settings on T collar with an FiO2 50%  . General: Comfortable at this time . Eyes: Grossly normal lids, irises & conjunctiva . ENT: grossly tongue is normal . Neck: no obvious mass . Cardiovascular: S1 S2 normal no gallop . Respiratory: No rhonchi coarse breath sounds are noted . Abdomen: soft . Skin: no rash seen on limited exam . Musculoskeletal: not rigid . Psychiatric:unable to assess . Neurologic: no seizure no involuntary movements         Lab Data:   Basic Metabolic Panel: Recent Labs  Lab 03/16/20 0540 03/17/20 0633 03/18/20 0355 03/19/20 0542  NA 150* 149* 146* 142  K 3.5 3.6 3.4* 3.7  CL 109 106 106 103  CO2 34* 33* 32 31  GLUCOSE 132* 140* 126* 128*  BUN 44* 35* 37* 35*  CREATININE 1.18 1.11 1.22 1.16  CALCIUM 9.8 9.4 9.2 9.1    ABG: Recent Labs  Lab 03/18/20 0934  PHART 7.368  PCO2ART 60.7*  PO2ART 56.5*  HCO3 34.1*  O2SAT 88.0    Liver Function Tests: No results for input(s): AST, ALT, ALKPHOS, BILITOT, PROT, ALBUMIN in the last 168 hours. No results for input(s): LIPASE, AMYLASE in the last 168 hours. No results for input(s): AMMONIA in the last 168 hours.  CBC: Recent Labs  Lab 03/16/20 0540 03/17/20 0633 03/18/20 0355 03/19/20 0542  WBC 6.7  --  8.7 9.9  HGB 6.8* 8.3* 7.7* 8.2*  HCT 24.1*  28.9* 26.8* 27.7*  MCV 106.2*  --  100.8* 99.6  PLT 234  --  204 196    Cardiac Enzymes: No results for input(s): CKTOTAL, CKMB, CKMBINDEX, TROPONINI in the last 168 hours.  BNP (last 3 results) No results for input(s): BNP in the last 8760 hours.  ProBNP (last 3 results) No results for input(s): PROBNP in the last 8760 hours.  Radiological Exams: No results found.  Assessment/Plan Active Problems:   Acute on chronic respiratory failure with hypoxia (HCC)   COVID-19 virus infection   Chronic atrial fibrillation with rapid ventricular response (HCC)   Chronic kidney disease, stage III (moderate)   Healthcare-associated pneumonia   1. Acute on chronic respiratory failure hypoxia plan is clinically continue with the wean on T collar patient's goal today will be 24 hours. 2. COVID-19 virus infection resolved we will continue to monitor 3. Chronic atrial fibrillation rate controlled 4. Chronic kidney disease stage III at baseline 5. Healthcare associated pneumonia treated we will continue to monitor   I have personally seen and evaluated the patient, evaluated laboratory and imaging results, formulated the assessment and plan and placed orders. The Patient requires high complexity decision making with multiple systems involvement.  Rounds were done with the Respiratory Therapy Director and Staff therapists and discussed with nursing staff also.  Yevonne Pax, MD [facility] Pulmonary Critical Care Medicine Sleep Medicine

## 2020-03-21 DIAGNOSIS — I482 Chronic atrial fibrillation, unspecified: Secondary | ICD-10-CM | POA: Diagnosis not present

## 2020-03-21 DIAGNOSIS — J9621 Acute and chronic respiratory failure with hypoxia: Secondary | ICD-10-CM | POA: Diagnosis not present

## 2020-03-21 DIAGNOSIS — U071 COVID-19: Secondary | ICD-10-CM | POA: Diagnosis not present

## 2020-03-21 DIAGNOSIS — N183 Chronic kidney disease, stage 3 unspecified: Secondary | ICD-10-CM | POA: Diagnosis not present

## 2020-03-21 NOTE — Progress Notes (Signed)
Pulmonary Critical Care Medicine [facility] [place]   PULMONARY CRITICAL CARE SERVICE  PROGRESS NOTE  Date of Service: 03/21/2020  Russell Rios  VOJ:[id number]  DOB: 1946-01-07   DOA: 03/02/2020  Referring Physician: Carron Curie, MD  HPI: Russell Rios is a 74 y.o. male seen for follow up of Acute on Chronic Respiratory Failure.  Patient currently is on T collar has been on 50% FiO2 yesterday was able to 12 hours and placed back on the ventilator at nighttime Concern about whether he might be having sundowning  Medications: Reviewed on Rounds  Physical Exam:  Vitals: Temperature 100.1 pulse 81 respiratory 26 blood pressure is 111/57 saturations 100%  Ventilator Settings on T collar with FiO2 50%  . General: Comfortable at this time . Eyes: Grossly normal lids, irises & conjunctiva . ENT: grossly tongue is normal . Neck: no obvious mass . Cardiovascular: S1 S2 normal no gallop . Respiratory: No rhonchi no rales are noted at this time . Abdomen: soft . Skin: no rash seen on limited exam . Musculoskeletal: not rigid . Psychiatric:unable to assess . Neurologic: no seizure no involuntary movements         Lab Data:   Basic Metabolic Panel: Recent Labs  Lab 03/16/20 0540 03/17/20 0633 03/18/20 0355 03/19/20 0542  NA 150* 149* 146* 142  K 3.5 3.6 3.4* 3.7  CL 109 106 106 103  CO2 34* 33* 32 31  GLUCOSE 132* 140* 126* 128*  BUN 44* 35* 37* 35*  CREATININE 1.18 1.11 1.22 1.16  CALCIUM 9.8 9.4 9.2 9.1    ABG: Recent Labs  Lab 03/18/20 0934  PHART 7.368  PCO2ART 60.7*  PO2ART 56.5*  HCO3 34.1*  O2SAT 88.0    Liver Function Tests: No results for input(s): AST, ALT, ALKPHOS, BILITOT, PROT, ALBUMIN in the last 168 hours. No results for input(s): LIPASE, AMYLASE in the last 168 hours. No results for input(s): AMMONIA in the last 168 hours.  CBC: Recent Labs  Lab 03/16/20 0540 03/17/20 0633 03/18/20 0355 03/19/20 0542  WBC 6.7  --  8.7 9.9  HGB  6.8* 8.3* 7.7* 8.2*  HCT 24.1* 28.9* 26.8* 27.7*  MCV 106.2*  --  100.8* 99.6  PLT 234  --  204 196    Cardiac Enzymes: No results for input(s): CKTOTAL, CKMB, CKMBINDEX, TROPONINI in the last 168 hours.  BNP (last 3 results) No results for input(s): BNP in the last 8760 hours.  ProBNP (last 3 results) No results for input(s): PROBNP in the last 8760 hours.  Radiological Exams: No results found.  Assessment/Plan Active Problems:   Acute on chronic respiratory failure with hypoxia (HCC)   COVID-19 virus infection   Chronic atrial fibrillation with rapid ventricular response (HCC)   Chronic kidney disease, stage III (moderate)   Healthcare-associated pneumonia   1. Acute on chronic respiratory failure hypoxia we will continue with T collar weaning as tolerated.  Patient might need to have medications adjusted for possible sundowning 2. COVID-19 virus infection resolved 3. Chronic atrial fibrillation rate controlled at this time we will continue to follow 4. Chronic kidney disease stage III no change 5. Healthcare associated pneumonia treated clinically improved   I have personally seen and evaluated the patient, evaluated laboratory and imaging results, formulated the assessment and plan and placed orders. The Patient requires high complexity decision making with multiple systems involvement.  Rounds were done with the Respiratory Therapy Director and Staff therapists and discussed with nursing staff also.  Cherrish Vitali A  Humphrey Rolls, MD [facility] Pulmonary Critical Care Medicine Sleep Medicine

## 2020-03-22 ENCOUNTER — Other Ambulatory Visit (HOSPITAL_COMMUNITY): Payer: Medicare Other

## 2020-03-22 DIAGNOSIS — N183 Chronic kidney disease, stage 3 unspecified: Secondary | ICD-10-CM | POA: Diagnosis not present

## 2020-03-22 DIAGNOSIS — I482 Chronic atrial fibrillation, unspecified: Secondary | ICD-10-CM | POA: Diagnosis not present

## 2020-03-22 DIAGNOSIS — U071 COVID-19: Secondary | ICD-10-CM | POA: Diagnosis not present

## 2020-03-22 DIAGNOSIS — J9621 Acute and chronic respiratory failure with hypoxia: Secondary | ICD-10-CM | POA: Diagnosis not present

## 2020-03-22 LAB — BASIC METABOLIC PANEL WITH GFR
Anion gap: 9 (ref 5–15)
BUN: 39 mg/dL — ABNORMAL HIGH (ref 8–23)
CO2: 31 mmol/L (ref 22–32)
Calcium: 9 mg/dL (ref 8.9–10.3)
Chloride: 101 mmol/L (ref 98–111)
Creatinine, Ser: 1.31 mg/dL — ABNORMAL HIGH (ref 0.61–1.24)
GFR calc Af Amer: >60 mL/min
GFR calc non Af Amer: 53 mL/min — ABNORMAL LOW
Glucose, Bld: 136 mg/dL — ABNORMAL HIGH (ref 70–99)
Potassium: 3.6 mmol/L (ref 3.5–5.1)
Sodium: 141 mmol/L (ref 135–145)

## 2020-03-22 LAB — CBC
HCT: 23.6 % — ABNORMAL LOW (ref 39.0–52.0)
HCT: 28.7 % — ABNORMAL LOW (ref 39.0–52.0)
Hemoglobin: 6.9 g/dL — CL (ref 13.0–17.0)
Hemoglobin: 8.8 g/dL — ABNORMAL LOW (ref 13.0–17.0)
MCH: 29.1 pg (ref 26.0–34.0)
MCH: 29.6 pg (ref 26.0–34.0)
MCHC: 29.2 g/dL — ABNORMAL LOW (ref 30.0–36.0)
MCHC: 30.7 g/dL (ref 30.0–36.0)
MCV: 99.6 fL (ref 80.0–100.0)
MCV: 96.6 fL (ref 80.0–100.0)
Platelets: 209 K/uL (ref 150–400)
Platelets: 216 10*3/uL (ref 150–400)
RBC: 2.37 MIL/uL — ABNORMAL LOW (ref 4.22–5.81)
RBC: 2.97 MIL/uL — ABNORMAL LOW (ref 4.22–5.81)
RDW: 19.8 % — ABNORMAL HIGH (ref 11.5–15.5)
RDW: 19.6 % — ABNORMAL HIGH (ref 11.5–15.5)
WBC: 6.9 K/uL (ref 4.0–10.5)
WBC: 6.7 10*3/uL (ref 4.0–10.5)
nRBC: 0.3 % — ABNORMAL HIGH (ref 0.0–0.2)
nRBC: 0.3 % — ABNORMAL HIGH (ref 0.0–0.2)

## 2020-03-22 LAB — CULTURE, RESPIRATORY W GRAM STAIN: Gram Stain: NONE SEEN

## 2020-03-22 LAB — PREPARE RBC (CROSSMATCH)

## 2020-03-22 NOTE — Progress Notes (Signed)
Pulmonary Critical Care Medicine [facility] [place]   PULMONARY CRITICAL CARE SERVICE  PROGRESS NOTE  Date of Service: 03/22/2020  Russell Rios  [id number]:[id number]  DOB: 07-Dec-1945   DOA: 03/02/2020  Referring Physician: Carron Curie, MD  HPI: Russell Rios is a 74 y.o. male seen for follow up of Acute on Chronic Respiratory Failure.  Patient currently is on pressure control mode has been on 45% FiO2 with good saturations noted.  Was able to tolerate T collar for about 17 hours then was placed back on the ventilator  Medications: Reviewed on Rounds  Physical Exam:  Vitals: Temperature 98.8 pulse 89 respiratory 34 blood pressure 98/43 saturations 97%  Ventilator Settings mode ventilation pressure assist control FiO2 45% tidal volume 567 IP 26 PEEP 5  . General: Comfortable at this time . Eyes: Grossly normal lids, irises & conjunctiva . ENT: grossly tongue is normal . Neck: no obvious mass . Cardiovascular: S1 S2 normal no gallop . Respiratory: No rhonchi coarse breath sounds are noted . Abdomen: soft . Skin: no rash seen on limited exam . Musculoskeletal: not rigid . Psychiatric:unable to assess . Neurologic: no seizure no involuntary movements         Lab Data:   Basic Metabolic Panel: Recent Labs  Lab 03/16/20 0540 03/17/20 0633 03/18/20 0355 03/19/20 0542 03/22/20 0654  NA 150* 149* 146* 142 141  K 3.5 3.6 3.4* 3.7 3.6  CL 109 106 106 103 101  CO2 34* 33* 32 31 31  GLUCOSE 132* 140* 126* 128* 136*  BUN 44* 35* 37* 35* 39*  CREATININE 1.18 1.11 1.22 1.16 1.31*  CALCIUM 9.8 9.4 9.2 9.1 9.0    ABG: Recent Labs  Lab 03/18/20 0934  PHART 7.368  PCO2ART 60.7*  PO2ART 56.5*  HCO3 34.1*  O2SAT 88.0    Liver Function Tests: No results for input(s): AST, ALT, ALKPHOS, BILITOT, PROT, ALBUMIN in the last 168 hours. No results for input(s): LIPASE, AMYLASE in the last 168 hours. No results for input(s): AMMONIA in the last 168 hours.  CBC: Recent  Labs  Lab 03/16/20 0540 03/17/20 0633 03/18/20 0355 03/19/20 0542 03/22/20 0654  WBC 6.7  --  8.7 9.9 6.9  HGB 6.8* 8.3* 7.7* 8.2* 6.9*  HCT 24.1* 28.9* 26.8* 27.7* 23.6*  MCV 106.2*  --  100.8* 99.6 99.6  PLT 234  --  204 196 209    Cardiac Enzymes: No results for input(s): CKTOTAL, CKMB, CKMBINDEX, TROPONINI in the last 168 hours.  BNP (last 3 results) No results for input(s): BNP in the last 8760 hours.  ProBNP (last 3 results) No results for input(s): PROBNP in the last 8760 hours.  Radiological Exams: DG CHEST PORT 1 VIEW  Result Date: 03/22/2020 CLINICAL DATA:  Pneumonia. EXAM: PORTABLE CHEST 1 VIEW COMPARISON:  03/18/2020 and 03/09/2020 and 03/02/2020 FINDINGS: Tracheostomy tube in good position. Heart size and pulmonary vascularity are normal. Diffuse bilateral pulmonary infiltrates are slightly accentuated as compared to the prior study. No effusions. No bone abnormality. IMPRESSION: Slight increase in the diffuse bilateral pulmonary infiltrates. Electronically Signed   By: Francene Boyers M.D.   On: 03/22/2020 07:57    Assessment/Plan Active Problems:   Acute on chronic respiratory failure with hypoxia (HCC)   COVID-19 virus infection   Chronic atrial fibrillation with rapid ventricular response (HCC)   Chronic kidney disease, stage III (moderate)   Healthcare-associated pneumonia   1. Acute on chronic respiratory failure hypoxia we will continue with full support on the  ventilator right now.  Respiratory therapy is going to reassess and try to resume the T collar once again 2. COVID-19 virus infection resolved 3. Chronic atrial fibrillation rate is controlled 4. Chronic kidney disease stage III at baseline we will continue to monitor 5. Healthcare associated pneumonia clinically resolving however still has some diffuse infiltrates noted likely secondary to the Covid   I have personally seen and evaluated the patient, evaluated laboratory and imaging results,  formulated the assessment and plan and placed orders. The Patient requires high complexity decision making with multiple systems involvement.  Rounds were done with the Respiratory Therapy Director and Staff therapists and discussed with nursing staff also.  Allyne Gee, MD [facility] Pulmonary Critical Care Medicine Sleep Medicine

## 2020-03-23 DIAGNOSIS — N183 Chronic kidney disease, stage 3 unspecified: Secondary | ICD-10-CM | POA: Diagnosis not present

## 2020-03-23 DIAGNOSIS — I482 Chronic atrial fibrillation, unspecified: Secondary | ICD-10-CM | POA: Diagnosis not present

## 2020-03-23 DIAGNOSIS — J9621 Acute and chronic respiratory failure with hypoxia: Secondary | ICD-10-CM | POA: Diagnosis not present

## 2020-03-23 DIAGNOSIS — U071 COVID-19: Secondary | ICD-10-CM | POA: Diagnosis not present

## 2020-03-23 LAB — BLOOD GAS, ARTERIAL
Acid-Base Excess: 7.5 mmol/L — ABNORMAL HIGH (ref 0.0–2.0)
Bicarbonate: 31.7 mmol/L — ABNORMAL HIGH (ref 20.0–28.0)
FIO2: 60
O2 Saturation: 81.1 %
Patient temperature: 36.9
pCO2 arterial: 46.1 mmHg (ref 32.0–48.0)
pH, Arterial: 7.451 — ABNORMAL HIGH (ref 7.350–7.450)
pO2, Arterial: 44.9 mmHg — ABNORMAL LOW (ref 83.0–108.0)

## 2020-03-23 LAB — CBC
HCT: 26 % — ABNORMAL LOW (ref 39.0–52.0)
HCT: 27.7 % — ABNORMAL LOW (ref 39.0–52.0)
Hemoglobin: 7.9 g/dL — ABNORMAL LOW (ref 13.0–17.0)
Hemoglobin: 8.1 g/dL — ABNORMAL LOW (ref 13.0–17.0)
MCH: 29.5 pg (ref 26.0–34.0)
MCH: 28.5 pg (ref 26.0–34.0)
MCHC: 30.4 g/dL (ref 30.0–36.0)
MCHC: 29.2 g/dL — ABNORMAL LOW (ref 30.0–36.0)
MCV: 97 fL (ref 80.0–100.0)
MCV: 97.5 fL (ref 80.0–100.0)
Platelets: 236 10*3/uL (ref 150–400)
Platelets: 251 10*3/uL (ref 150–400)
RBC: 2.68 MIL/uL — ABNORMAL LOW (ref 4.22–5.81)
RBC: 2.84 MIL/uL — ABNORMAL LOW (ref 4.22–5.81)
RDW: 19.5 % — ABNORMAL HIGH (ref 11.5–15.5)
RDW: 19.3 % — ABNORMAL HIGH (ref 11.5–15.5)
WBC: 6.2 10*3/uL (ref 4.0–10.5)
WBC: 6.4 10*3/uL (ref 4.0–10.5)
nRBC: 0.3 % — ABNORMAL HIGH (ref 0.0–0.2)
nRBC: 0.3 % — ABNORMAL HIGH (ref 0.0–0.2)

## 2020-03-23 LAB — BASIC METABOLIC PANEL
Anion gap: 11 (ref 5–15)
BUN: 38 mg/dL — ABNORMAL HIGH (ref 8–23)
CO2: 29 mmol/L (ref 22–32)
Calcium: 9 mg/dL (ref 8.9–10.3)
Chloride: 100 mmol/L (ref 98–111)
Creatinine, Ser: 1.32 mg/dL — ABNORMAL HIGH (ref 0.61–1.24)
GFR calc Af Amer: >60 mL/min (ref >60)
GFR calc non Af Amer: 53 mL/min — ABNORMAL LOW (ref >60)
Glucose, Bld: 123 mg/dL — ABNORMAL HIGH (ref 70–99)
Potassium: 3.5 mmol/L (ref 3.5–5.1)
Sodium: 140 mmol/L (ref 135–145)

## 2020-03-23 LAB — BPAM RBC
Blood Product Expiration Date: 202105312359
ISSUE DATE / TIME: 202105031101
Unit Type and Rh: 5100

## 2020-03-23 LAB — TYPE AND SCREEN
ABO/RH(D): O POS
Antibody Screen: NEGATIVE
Unit division: 0

## 2020-03-23 NOTE — Progress Notes (Signed)
Pulmonary Critical Care Medicine [place]   PULMONARY CRITICAL CARE SERVICE  PROGRESS NOTE  Date of Service: 03/23/2020  Russell Rios  [id number]:[id number]  DOB: Jul 15, 1946   DOA: 03/02/2020  Referring Physician: Merton Border, MD  HPI: Russell Rios is a 74 y.o. male seen for follow up of Acute on Chronic Respiratory Failure.  Patient is on T collar right now appears to be comfortable without distress  Medications: Reviewed on Rounds  Physical Exam:  Vitals: Temperature is 97.2 pulse 76 respiratory 24 blood pressure is 122/58 saturations 99%  Ventilator Settings on T collar right now  . General: Comfortable at this time . Eyes: Grossly normal lids, irises & conjunctiva . ENT: grossly tongue is normal . Neck: no obvious mass . Cardiovascular: S1 S2 normal no gallop . Respiratory: Coarse breath sounds with few scattered rhonchi . Abdomen: soft . Skin: no rash seen on limited exam . Musculoskeletal: not rigid . Psychiatric:unable to assess . Neurologic: no seizure no involuntary movements         Lab Data:   Basic Metabolic Panel: Recent Labs  Lab 03/17/20 0633 03/18/20 0355 03/19/20 0542 03/22/20 0654 03/23/20 0504  NA 149* 146* 142 141 140  K 3.6 3.4* 3.7 3.6 3.5  CL 106 106 103 101 100  CO2 33* 32 31 31 29   GLUCOSE 140* 126* 128* 136* 123*  BUN 35* 37* 35* 39* 38*  CREATININE 1.11 1.22 1.16 1.31* 1.32*  CALCIUM 9.4 9.2 9.1 9.0 9.0    ABG: Recent Labs  Lab 03/18/20 0934  PHART 7.368  PCO2ART 60.7*  PO2ART 56.5*  HCO3 34.1*  O2SAT 88.0    Liver Function Tests: No results for input(s): AST, ALT, ALKPHOS, BILITOT, PROT, ALBUMIN in the last 168 hours. No results for input(s): LIPASE, AMYLASE in the last 168 hours. No results for input(s): AMMONIA in the last 168 hours.  CBC: Recent Labs  Lab 03/18/20 0355 03/19/20 0542 03/22/20 0654 03/22/20 1700 03/23/20 0504  WBC 8.7 9.9 6.9 6.7 6.2  HGB 7.7* 8.2* 6.9* 8.8* 7.9*  HCT 26.8*  27.7* 23.6* 28.7* 26.0*  MCV 100.8* 99.6 99.6 96.6 97.0  PLT 204 196 209 216 236    Cardiac Enzymes: No results for input(s): CKTOTAL, CKMB, CKMBINDEX, TROPONINI in the last 168 hours.  BNP (last 3 results) No results for input(s): BNP in the last 8760 hours.  ProBNP (last 3 results) No results for input(s): PROBNP in the last 8760 hours.  Radiological Exams: DG CHEST PORT 1 VIEW  Result Date: 03/22/2020 CLINICAL DATA:  Pulmonary edema EXAM: PORTABLE CHEST 1 VIEW COMPARISON:  03/22/2020 FINDINGS: Tracheostomy is unchanged. Diffuse interstitial and airspace opacities are noted, slightly worsening since prior study. Heart is borderline in size. No visible effusions or pneumothorax. IMPRESSION: Diffuse interstitial and airspace disease, slightly worsening. This could reflect worsening edema or infection. Electronically Signed   By: Rolm Baptise M.D.   On: 03/22/2020 17:04   DG CHEST PORT 1 VIEW  Result Date: 03/22/2020 CLINICAL DATA:  Pneumonia. EXAM: PORTABLE CHEST 1 VIEW COMPARISON:  03/18/2020 and 03/09/2020 and 03/02/2020 FINDINGS: Tracheostomy tube in good position. Heart size and pulmonary vascularity are normal. Diffuse bilateral pulmonary infiltrates are slightly accentuated as compared to the prior study. No effusions. No bone abnormality. IMPRESSION: Slight increase in the diffuse bilateral pulmonary infiltrates. Electronically Signed   By: Lorriane Shire M.D.   On: 03/22/2020 07:57    Assessment/Plan Active Problems:   Acute on chronic respiratory failure with hypoxia (  HCC)   COVID-19 virus infection   Chronic atrial fibrillation with rapid ventricular response (HCC)   Chronic kidney disease, stage III (moderate)   Healthcare-associated pneumonia   1. Acute on chronic respiratory failure hypoxia patient will continue with T collar trials as tolerated.  Continue aggressive pulmonary toilet supportive care. 2. COVID-19 virus infection resolved we will continue supportive care  patient does have some bilateral infiltrate still noted. 3. Chronic kidney disease stage III no change we will continue to follow 4. Healthcare associated pneumonia treated clinically improved 5. Chronic atrial fibrillation rate is controlled at this time   I have personally seen and evaluated the patient, evaluated laboratory and imaging results, formulated the assessment and plan and placed orders. The Patient requires high complexity decision making with multiple systems involvement.  Rounds were done with the Respiratory Therapy Director and Staff therapists and discussed with nursing staff also.  Yevonne Pax, MD [facility] Pulmonary Critical Care Medicine Sleep Medicine

## 2020-03-24 ENCOUNTER — Other Ambulatory Visit (HOSPITAL_COMMUNITY): Payer: Medicare Other

## 2020-03-24 DIAGNOSIS — J9621 Acute and chronic respiratory failure with hypoxia: Secondary | ICD-10-CM | POA: Diagnosis not present

## 2020-03-24 DIAGNOSIS — N183 Chronic kidney disease, stage 3 unspecified: Secondary | ICD-10-CM | POA: Diagnosis not present

## 2020-03-24 DIAGNOSIS — U071 COVID-19: Secondary | ICD-10-CM | POA: Diagnosis not present

## 2020-03-24 DIAGNOSIS — I482 Chronic atrial fibrillation, unspecified: Secondary | ICD-10-CM | POA: Diagnosis not present

## 2020-03-24 LAB — CBC
HCT: 28 % — ABNORMAL LOW (ref 39.0–52.0)
Hemoglobin: 8.2 g/dL — ABNORMAL LOW (ref 13.0–17.0)
MCH: 28.7 pg (ref 26.0–34.0)
MCHC: 29.3 g/dL — ABNORMAL LOW (ref 30.0–36.0)
MCV: 97.9 fL (ref 80.0–100.0)
Platelets: 283 10*3/uL (ref 150–400)
RBC: 2.86 MIL/uL — ABNORMAL LOW (ref 4.22–5.81)
RDW: 19 % — ABNORMAL HIGH (ref 11.5–15.5)
WBC: 7.5 10*3/uL (ref 4.0–10.5)
nRBC: 0.4 % — ABNORMAL HIGH (ref 0.0–0.2)

## 2020-03-24 LAB — GENTAMICIN LEVEL, RANDOM: Gentamicin Rm: 4.6 ug/mL

## 2020-03-24 MED ORDER — IOHEXOL 350 MG/ML SOLN
80.0000 mL | Freq: Once | INTRAVENOUS | Status: AC | PRN
  Administered 2020-03-24: 80 mL via INTRAVENOUS

## 2020-03-24 MED ORDER — GENERIC EXTERNAL MEDICATION
Status: DC
Start: ? — End: 2020-03-24

## 2020-03-24 NOTE — H&P (Signed)
Chief Complaint: Pulmonary embolism  Referring Physician(s): Hijazi  Supervising Physician: Oley Balm  Patient Status: Russell Rios - In-pt  History of Present Illness: Russell Rios is a 74 y.o. male with recent COVID 19 infection. He has respiratory failure and has tracheostomy.  He developed hemoptysis today and urgent CT was obtained which showed = A couple subsegmental pulmonary emboli over the posterior left lower lobe.  Per Dr. Sharyon Medicus, he cannot be anticoagulated secondary to gastrointestinal bleeding.  We are asked to place an IVC Filter.  Past Medical History:  Diagnosis Date  . Acute on chronic respiratory failure with hypoxia (HCC)   . Chronic atrial fibrillation with rapid ventricular response (HCC)   . Chronic kidney disease, stage III (moderate)   . COVID-19 virus infection   . Healthcare-associated pneumonia     Allergies: Patient has no allergy information on record.  Medications: Prior to Admission medications   Not on File     No family history on file.  Social History   Socioeconomic History  . Marital status: Married    Spouse name: Not on file  . Number of children: Not on file  . Years of education: Not on file  . Highest education level: Not on file  Occupational History  . Not on file  Tobacco Use  . Smoking status: Not on file  Substance and Sexual Activity  . Alcohol use: Not on file  . Drug use: Not on file  . Sexual activity: Not on file  Other Topics Concern  . Not on file  Social History Narrative  . Not on file   Social Determinants of Health   Financial Resource Strain:   . Difficulty of Paying Living Expenses:   Food Insecurity:   . Worried About Programme researcher, broadcasting/film/video in the Last Year:   . Barista in the Last Year:   Transportation Needs:   . Freight forwarder (Medical):   Marland Kitchen Lack of Transportation (Non-Medical):   Physical Activity:   . Days of Exercise per Week:   . Minutes of Exercise per  Session:   Stress:   . Feeling of Stress :   Social Connections:   . Frequency of Communication with Friends and Family:   . Frequency of Social Gatherings with Friends and Family:   . Attends Religious Services:   . Active Member of Clubs or Organizations:   . Attends Banker Meetings:   Marland Kitchen Marital Status:     Review of Systems  Unable to perform ROS: Other   Physical Exam Constitutional:      Appearance: Normal appearance.  HENT:     Head: Normocephalic and atraumatic.  Cardiovascular:     Rate and Rhythm: Normal rate.  Pulmonary:     Comments: Tracheostomy/vent Abdominal:     Palpations: Abdomen is soft.     Tenderness: There is abdominal tenderness.  Skin:    General: Skin is warm and dry.  Neurological:     General: No focal deficit present.     Mental Status: He is alert.  Psychiatric:        Mood and Affect: Mood normal.        Behavior: Behavior normal.     Imaging: CT ANGIO CHEST PE W OR WO CONTRAST  Addendum Date: 03/24/2020   ADDENDUM REPORT: 03/24/2020 15:22 ADDENDUM: Critical Value/emergent results were called by telephone at the time of interpretation on 03/24/2020 at 3:15 p.m. to provider Dr. Sharyon Medicus, who  verbally acknowledged these results. Electronically Signed   By: Elberta Fortis M.D.   On: 03/24/2020 15:22   Result Date: 03/24/2020 CLINICAL DATA:  Hemoptysis.  Evaluate for pulmonary embolism. EXAM: CT ANGIOGRAPHY CHEST WITH CONTRAST TECHNIQUE: Multidetector CT imaging of the chest was performed using the standard protocol during bolus administration of intravenous contrast. Multiplanar CT image reconstructions and MIPs were obtained to evaluate the vascular anatomy. CONTRAST:  66mL OMNIPAQUE IOHEXOL 350 MG/ML SOLN COMPARISON:  Chest x-ray 03/22/2020 FINDINGS: Cardiovascular: Heart is borderline enlarged. Calcified plaque over the left main and 3 vessel coronary arteries. Mild ectasia of the ascending thoracic aorta measuring 3.8 cm in AP diameter.  Normal takeoff of the great vessels. There is mild calcified plaque over the descending thoracic aorta with minimal noncalcified mural thrombus. Pulmonary arterial system is well opacified. There are a few small emboli over the posterior basal segment of the left lower lobe. No evidence of right-sided emboli. Elevated RV/LV ratio. Mediastinum/Nodes: Tracheostomy tube is in adequate position. Mild mediastinal adenopathy. 1.2 cm precarinal lymph node. 1.1 cm high right paramediastinal lymph node. Minimal bilateral hilar adenopathy. Remaining mediastinal structures are unremarkable. Lungs/Pleura: Moderate centrilobular emphysematous disease is present. There is consolidation over the left upper lobe/apex. Findings suggesting patchy fibrotic change over the mid to lower lungs with a few areas of mixed interstitial airspace density. Minimal bronchiectatic change. Upper Abdomen: Calcified plaque over the proximal abdominal aorta. No acute findings. Musculoskeletal: Degenerative change of the spine. Review of the MIP images confirms the above findings. IMPRESSION: 1. A couple subsegmental pulmonary emboli over the posterior left lower lobe. 2. 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 Mild mediastinal and hilar adenopathy likely reactive. 3. Aortic Atherosclerosis (ICD10-I70.0) and Emphysema (ICD10-J43.9). Atherosclerotic coronary artery disease. 4. 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 Recommend annual imaging followup by CTA or MRA. This recommendation follows 2010 ACCF/AHA/AATS/ACR/ASA/SCA/SCAI/SIR/STS/SVM Guidelines for the Diagnosis and Management of Patients with Thoracic Aortic Disease. Circulation.2010; 121: J500-X381. Aortic aneurysm NOS (ICD10-I71.9). Electronically Signed: By: Elberta Fortis M.D.  On: 03/24/2020 15:11   US RENAL  Result Date: 03/08/2020 CLINICAL DATA:  Acute kidney injury EXAM: RENAL / URINARY TRACT ULTRASOUND COMPLETE COMPARISON:  None. FINDINGS: Right Kidney: Renal measurements: 11.4 x 4.8 x 5.2 cm = volume: 149.4 mL . Echogenicity within normal limits. No mass or hydronephrosis visualized. Left Kidney: Not identified. Bladder: Decompressed by Foley catheter. Other: None. IMPRESSION: 1. Normal ultrasound appearance of the right kidney. 2. Nonvisualized left kidney which may be congenitally or surgically absent versus severely atrophied. Electronically Signed   By: Jasmine Pang M.D.   On: 03/08/2020 19:15   DG ABDOMEN PEG TUBE LOCATION  Result Date: 03/02/2020 CLINICAL DATA:  Check gastrostomy placement EXAM: ABDOMEN - 1 VIEW COMPARISON:  None. FINDINGS: Contrast material is been injected through the indwelling gastrostomy catheter. This flows directly into the stomach and subsequently into the proximal small bowel. No obstructive changes are seen. Embolic changes are noted in the left internal iliac artery with evidence of prior stent graft therapy. IMPRESSION: Gastrostomy catheter within the stomach. Electronically Signed   By: Alcide Clever M.D.   On: 03/02/2020 23:07   DG CHEST PORT 1 VIEW  Result Date: 03/22/2020 CLINICAL DATA:  Pulmonary edema EXAM: PORTABLE CHEST 1 VIEW COMPARISON:  03/22/2020 FINDINGS: Tracheostomy is unchanged. Diffuse interstitial and airspace opacities are noted, slightly  worsening since prior study. Heart is borderline in size. No visible effusions or pneumothorax. IMPRESSION: Diffuse interstitial and airspace disease, slightly worsening. This could reflect worsening edema or infection. Electronically Signed   By: Rolm Baptise M.D.   On: 03/22/2020 17:04   DG CHEST PORT 1 VIEW  Result Date: 03/22/2020 CLINICAL DATA:  Pneumonia. EXAM: PORTABLE CHEST 1 VIEW COMPARISON:  03/18/2020 and 03/09/2020 and 03/02/2020 FINDINGS: Tracheostomy tube in good  position. Heart size and pulmonary vascularity are normal. Diffuse bilateral pulmonary infiltrates are slightly accentuated as compared to the prior study. No effusions. No bone abnormality. IMPRESSION: Slight increase in the diffuse bilateral pulmonary infiltrates. Electronically Signed   By: Lorriane Shire M.D.   On: 03/22/2020 07:57   DG Chest Port 1 View  Result Date: 03/18/2020 CLINICAL DATA:  Hypoxia. EXAM: PORTABLE CHEST 1 VIEW COMPARISON:  March 09, 2020. FINDINGS: Stable cardiomediastinal silhouette. Tracheostomy tube is in good position. No pneumothorax or pleural effusion is noted. Stable bilateral interstitial densities are noted throughout both lungs most consistent with scarring or fibrosis, although acute superimposed edema or inflammation cannot be excluded. Bony thorax is unremarkable. IMPRESSION: Stable bilateral interstitial densities are noted throughout both lungs most consistent with scarring or fibrosis, although acute superimposed edema or inflammation cannot be excluded. Electronically Signed   By: Marijo Conception M.D.   On: 03/18/2020 12:23   DG Chest Port 1 View  Result Date: 03/09/2020 CLINICAL DATA:  Acute on chronic respiratory failure EXAM: PORTABLE CHEST 1 VIEW COMPARISON:  03/02/2020 FINDINGS: Stable cardiomediastinal contours. Tracheostomy tube remains in place. Diffuse coarsened interstitial markings throughout both lungs is similar in appearance to the previous study. No definite new superimposed airspace consolidation. No pneumothorax is seen. IMPRESSION: No significant interval change. Findings favor chronic interstitial lung changes. Electronically Signed   By: Davina Poke D.O.   On: 03/09/2020 16:51   DG Chest Port 1 View  Result Date: 03/02/2020 CLINICAL DATA:  Increased secretions EXAM: PORTABLE CHEST 1 VIEW COMPARISON:  None. FINDINGS: Tracheostomy tube is noted in satisfactory position. Cardiac shadow is within normal limits. Aortic calcifications are  seen. Diffuse interstitial opacities are noted throughout both lungs likely of a chronic nature although acute on chronic infiltrates cannot be excluded given the lack of a previous film. No bony abnormality is noted. IMPRESSION: Diffuse interstitial opacities likely chronic in nature although an acute on chronic component cannot be totally excluded. Electronically Signed   By: Inez Catalina M.D.   On: 03/02/2020 23:08   ECHOCARDIOGRAM COMPLETE  Result Date: 03/11/2020    ECHOCARDIOGRAM REPORT   Patient Name:   KIPPER BUCH Date of Exam: 03/11/2020 Medical Rec #:  [id number]    Height: Accession #:    [id number]   Weight: Date of Birth:  01-Dec-1945    BSA: Patient Age:    30 years     BP:           111/37 mmHg Patient Gender: M            HR:           88 bpm. Exam Location:  Inpatient Procedure: 2D Echo, Cardiac Doppler and Color Doppler Indications:     Acute Coronary Syndrome I24.9  History:         Patient has no prior history of Echocardiogram examinations.                  Arrythmias:Atrial Fibrillation. CKD.  Sonographer:     Clayton Lefort RDCS (  AE) Referring Phys:  [id number] ALI HIJAZI Diagnosing Phys: Orpah Cobb MD  Sonographer Comments: Limited patient mobility. Tracheostomy tube in place. IMPRESSIONS  1. Left ventricular ejection fraction, by estimation, is 55 to 60%. The left ventricle has low normal function. The left ventricle demonstrates regional wall motion abnormalities (see scoring diagram/findings for description). There is moderate concentric left ventricular hypertrophy of the anterior and posterior segments. Left ventricular diastolic parameters are consistent with Grade I diastolic dysfunction (impaired relaxation). There is mild hypokinesis of the left ventricular, basal-mid inferoseptal wall.  2. Right ventricular systolic function is normal. The right ventricular size is normal. Mildly increased right ventricular wall thickness. There is mildly elevated pulmonary artery systolic pressure.  3.  Left atrial size was moderately dilated.  4. The mitral valve is degenerative. Mild mitral valve regurgitation.  5. The aortic valve is tricuspid. Aortic valve regurgitation is not visualized. Mild to moderate aortic valve sclerosis/calcification is present, without any evidence of aortic stenosis.  6. There is Moderate (Grade III) atheroma plaque involving the ascending aorta and aortic root.  7. The inferior vena cava is dilated in size with >50% respiratory variability, suggesting right atrial pressure of 8 mmHg. FINDINGS  Left Ventricle: Left ventricular ejection fraction, by estimation, is 55 to 60%. The left ventricle has low normal function. The left ventricle demonstrates regional wall motion abnormalities. Mild hypokinesis of the left ventricular, basal-mid inferoseptal wall. The left ventricular internal cavity size was normal in size. There is moderate concentric left ventricular hypertrophy of the anterior and posterior segments. Left ventricular diastolic parameters are consistent with Grade I diastolic  dysfunction (impaired relaxation). Right Ventricle: Prominent moderator band. The right ventricular size is normal. Mildly increased right ventricular wall thickness. Right ventricular systolic function is normal. There is mildly elevated pulmonary artery systolic pressure. Left Atrium: Left atrial size was moderately dilated. Right Atrium: Right atrial size was mildly dilated. Pericardium: There is no evidence of pericardial effusion. Mitral Valve: The mitral valve is degenerative in appearance. Mild mitral annular calcification. Mild mitral valve regurgitation. MV peak gradient, 2.5 mmHg. The mean mitral valve gradient is 1.0 mmHg. Tricuspid Valve: The tricuspid valve is normal in structure. Tricuspid valve regurgitation is trivial. Aortic Valve: The aortic valve is tricuspid. . There is moderate thickening and moderate calcification of the aortic valve. Aortic valve regurgitation is not visualized.  Mild to moderate aortic valve sclerosis/calcification is present, without any evidence of aortic stenosis. Moderate aortic valve annular calcification. There is moderate thickening of the aortic valve. There is moderate calcification of the aortic valve. Aortic valve mean gradient measures 3.0 mmHg. Aortic valve peak gradient measures 7.1 mmHg. Aortic valve area, by VTI measures 3.32 cm. Pulmonic Valve: The pulmonic valve was normal in structure. Pulmonic valve regurgitation is not visualized. Aorta: The aortic root is normal in size and structure. There is moderate (Grade III) atheroma plaque involving the ascending aorta and aortic root. Venous: The inferior vena cava is dilated in size with greater than 50% respiratory variability, suggesting right atrial pressure of 8 mmHg. IAS/Shunts: No atrial level shunt detected by color flow Doppler. Additional Comments: There is no pleural effusion.  LEFT VENTRICLE PLAX 2D LVIDd:         3.55 cm LVIDs:         2.50 cm LV PW:         1.73 cm LV IVS:        1.35 cm LVOT diam:     2.20 cm LV SV:  73 LVOT Area:     3.80 cm  RIGHT VENTRICLE             IVC RV Basal diam:  2.20 cm     IVC diam: 2.21 cm RV S prime:     13.60 cm/s TAPSE (M-mode): 1.6 cm LEFT ATRIUM             RIGHT ATRIUM LA diam:        2.80 cm RA Area:     14.30 cm LA Vol (A2C):   71.2 ml RA Volume:   32.90 ml LA Vol (A4C):   50.5 ml LA Biplane Vol: 59.8 ml  AORTIC VALVE AV Area (Vmax):    3.40 cm AV Area (Vmean):   3.47 cm AV Area (VTI):     3.32 cm AV Vmax:           133.00 cm/s AV Vmean:          85.200 cm/s AV VTI:            0.221 m AV Peak Grad:      7.1 mmHg AV Mean Grad:      3.0 mmHg LVOT Vmax:         119.00 cm/s LVOT Vmean:        77.700 cm/s LVOT VTI:          0.193 m LVOT/AV VTI ratio: 0.87  AORTA Ao Root diam: 3.70 cm MITRAL VALVE MV Peak grad: 2.5 mmHg  SHUNTS MV Mean grad: 1.0 mmHg  Systemic VTI:  0.19 m MV Vmax:      0.80 m/s  Systemic Diam: 2.20 cm MV Vmean:     49.6 cm/s Orpah CobbAjay  Kadakia MD Electronically signed by Orpah CobbAjay Kadakia MD Signature Date/Time: 03/11/2020/11:04:54 AM    Final     Labs:  CBC: Recent Labs    03/22/20 1700 03/23/20 0504 03/23/20 1522 03/24/20 0727  WBC 6.7 6.2 6.4 7.5  HGB 8.8* 7.9* 8.1* 8.2*  HCT 28.7* 26.0* 27.7* 28.0*  PLT 216 236 251 283    COAGS: Recent Labs    03/03/20 0641  INR 1.5*  APTT 42*    BMP: Recent Labs    03/18/20 0355 03/19/20 0542 03/22/20 0654 03/23/20 0504  NA 146* 142 141 140  K 3.4* 3.7 3.6 3.5  CL 106 103 101 100  CO2 32 31 31 29   GLUCOSE 126* 128* 136* 123*  BUN 37* 35* 39* 38*  CALCIUM 9.2 9.1 9.0 9.0  CREATININE 1.22 1.16 1.31* 1.32*  GFRNONAA 58* >60 53* 53*  GFRAA >60 >60 >60 >60    LIVER FUNCTION TESTS: Recent Labs    03/03/20 0641  BILITOT 0.8  AST 37  ALT 66*  ALKPHOS 83  PROT 7.8  ALBUMIN 2.5*    TUMOR MARKERS: No results for input(s): AFPTM, CEA, CA199, CHROMGRNA in the last 8760 hours.  Assessment and Plan:  Hemoptysis, acute PE, unable to anticoagulate.  Will proceed with placement of inferior vena cava filter tomorrow.  Risks and benefits discussed with the patient including, but not limited to bleeding, infection, contrast induced renal failure, filter fracture or migration which can lead to emergency surgery or even death, strut penetration with damage or irritation to adjacent structures and caval thrombosis.  All of the patient's questions were answered, patient is agreeable to proceed. Consent signed and in chart.  Thank you for this interesting consult.  I greatly enjoyed meeting ITT IndustriesLynn Routt and look forward to participating in their  care.  A copy of this report was sent to the requesting provider on this date.  Electronically Signed: Gwynneth Macleod, PA-C   03/24/2020, 4:41 PM      I spent a total of 20 Minutes  in face to face in clinical consultation, greater than 50% of which was counseling/coordinating care for IVC filter.

## 2020-03-24 NOTE — Progress Notes (Addendum)
Pulmonary Critical Care Medicine [facility] [place]   PULMONARY CRITICAL CARE SERVICE  PROGRESS NOTE  Date of Service: 03/24/2020  [name]  XQJ:[id number]  DOB: [birth year]   DOA: 03/02/2020  Referring Physician: Carron Curie, MD  HPI: Russell Rios is a 74 y.o. male seen for follow up of Acute on Chronic Respiratory Failure.  Patient remains on full support on the ventilator at this time pressure control mode rate of 20 with FiO2 of 40% no fever or distress noted.  Medications: Reviewed on Rounds  Physical Exam:  Vitals: Pulse 89 respirations 22 BP 125/62 O2 sat 97% temp 98.3  Ventilator Settings ventilator mode AC PC rate of 20 respiratory pressure 12 PEEP of 5 and FiO2 of 40%  . General: Comfortable at this time . Eyes: Grossly normal lids, irises & conjunctiva . ENT: grossly tongue is normal . Neck: no obvious mass . Cardiovascular: S1 S2 normal no gallop . Respiratory: No rales or rhonchi noted . Abdomen: soft . Skin: no rash seen on limited exam . Musculoskeletal: not rigid . Psychiatric:unable to assess . Neurologic: no seizure no involuntary movements         Lab Data:   Basic Metabolic Panel: Recent Labs  Lab 03/18/20 0355 03/19/20 0542 03/22/20 0654 03/23/20 0504  NA 146* 142 141 140  K 3.4* 3.7 3.6 3.5  CL 106 103 101 100  CO2 32 31 31 29   GLUCOSE 126* 128* 136* 123*  BUN 37* 35* 39* 38*  CREATININE 1.22 1.16 1.31* 1.32*  CALCIUM 9.2 9.1 9.0 9.0    ABG: Recent Labs  Lab 03/18/20 0934 03/23/20 1405  PHART 7.368 7.451*  PCO2ART 60.7* 46.1  PO2ART 56.5* 44.9*  HCO3 34.1* 31.7*  O2SAT 88.0 81.1    Liver Function Tests: No results for input(s): AST, ALT, ALKPHOS, BILITOT, PROT, ALBUMIN in the last 168 hours. No results for input(s): LIPASE, AMYLASE in the last 168 hours. No results for input(s): AMMONIA in the last 168 hours.  CBC: Recent Labs  Lab 03/22/20 0654 03/22/20 1700 03/23/20 0504 03/23/20 1522 03/24/20 0727   WBC 6.9 6.7 6.2 6.4 7.5  HGB 6.9* 8.8* 7.9* 8.1* 8.2*  HCT 23.6* 28.7* 26.0* 27.7* 28.0*  MCV 99.6 96.6 97.0 97.5 97.9  PLT 209 216 236 251 283    Cardiac Enzymes: No results for input(s): CKTOTAL, CKMB, CKMBINDEX, TROPONINI in the last 168 hours.  BNP (last 3 results) No results for input(s): BNP in the last 8760 hours.  ProBNP (last 3 results) No results for input(s): PROBNP in the last 8760 hours.  Radiological Exams: CT ANGIO CHEST PE W OR WO CONTRAST  Addendum Date: 03/24/2020   ADDENDUM REPORT: 03/24/2020 15:22 ADDENDUM: Critical Value/emergent results were called by telephone at the time of interpretation on 03/24/2020 at 3:15 p.m. to provider Dr. 05/24/2020, who verbally acknowledged these results. Electronically Signed   By: Sharyon Medicus M.D.   On: 03/24/2020 15:22   Result Date: 03/24/2020 CLINICAL DATA:  Hemoptysis.  Evaluate for pulmonary embolism. EXAM: CT ANGIOGRAPHY CHEST WITH CONTRAST TECHNIQUE: Multidetector CT imaging of the chest was performed using the standard protocol during bolus administration of intravenous contrast. Multiplanar CT image reconstructions and MIPs were obtained to evaluate the vascular anatomy. CONTRAST:  58mL OMNIPAQUE IOHEXOL 350 MG/ML SOLN COMPARISON:  Chest x-ray 03/22/2020 FINDINGS: Cardiovascular: Heart is borderline enlarged. Calcified plaque over the left main and 3 vessel coronary arteries. Mild ectasia of the ascending thoracic aorta measuring 3.8 cm in AP diameter. Normal  takeoff of the great vessels. There is mild calcified plaque over the descending thoracic aorta with minimal noncalcified mural thrombus. Pulmonary arterial system is well opacified. There are a few small emboli over the posterior basal segment of the left lower lobe. No evidence of right-sided emboli. Elevated RV/LV ratio. Mediastinum/Nodes: Tracheostomy tube is in adequate position. Mild mediastinal adenopathy. 1.2 cm precarinal lymph node. 1.1 cm high right paramediastinal lymph  node. Minimal bilateral hilar adenopathy. Remaining mediastinal structures are unremarkable. Lungs/Pleura: Moderate centrilobular emphysematous disease is present. There is consolidation over the left upper lobe/apex. Findings suggesting patchy fibrotic change over the mid to lower lungs with a few areas of mixed interstitial airspace density. Minimal bronchiectatic change. Upper Abdomen: Calcified plaque over the proximal abdominal aorta. No acute findings. Musculoskeletal: Degenerative change of the spine. Review of the MIP images confirms the above findings. IMPRESSION: 1. A couple subsegmental pulmonary emboli over the posterior left lower lobe. 2. 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 Mild mediastinal and hilar adenopathy likely reactive. 3. Aortic Atherosclerosis (ICD10-I70.0) and Emphysema (ICD10-J43.9). Atherosclerotic coronary artery disease. 4. 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 Recommend annual imaging followup by CTA or MRA. This recommendation follows 2010 ACCF/AHA/AATS/ACR/ASA/SCA/SCAI/SIR/STS/SVM Guidelines for the Diagnosis and Management of Patients with Thoracic Aortic Disease. Circulation.2010; 121: X833-A250. Aortic aneurysm NOS (ICD10-I71.9). Electronically Signed: By: Marin Olp M.D. On: 03/24/2020 15:11    Assessment/Plan Active Problems:   Acute on chronic respiratory failure with hypoxia (HCC)   COVID-19 virus infection   Chronic atrial fibrillation with rapid ventricular response (HCC)   Chronic kidney disease, stage III (moderate)   Healthcare-associated pneumonia   1. Acute on chronic respiratory failure hypoxia patient will remain on full support at this time with a rate of 20 respiratory pressure 12 PEEP of 5 and FiO2 of 1% we will continue  aggressive pulmonary toilet supportive measures. 2. COVID-19 virus infection resolved we will continue supportive care patient does have some bilateral infiltrate still noted. 3. Chronic kidney disease stage III no change we will continue to follow 4. Healthcare associated pneumonia treated clinically improved 5. Chronic atrial fibrillation rate is controlled at this time   I have personally seen and evaluated the patient, evaluated laboratory and imaging results, formulated the assessment and plan and placed orders. The Patient requires high complexity decision making with multiple systems involvement.  Rounds were done with the Respiratory Therapy Director and Staff therapists and discussed with nursing staff also.  Allyne Gee, MD [facility] Pulmonary Critical Care Medicine Sleep Medicine

## 2020-03-25 ENCOUNTER — Encounter (HOSPITAL_COMMUNITY): Payer: Medicare Other

## 2020-03-25 ENCOUNTER — Encounter: Payer: Self-pay | Admitting: Internal Medicine

## 2020-03-25 ENCOUNTER — Other Ambulatory Visit (HOSPITAL_COMMUNITY): Payer: Medicare Other

## 2020-03-25 DIAGNOSIS — N179 Acute kidney failure, unspecified: Secondary | ICD-10-CM | POA: Diagnosis not present

## 2020-03-25 DIAGNOSIS — I482 Chronic atrial fibrillation, unspecified: Secondary | ICD-10-CM | POA: Diagnosis not present

## 2020-03-25 DIAGNOSIS — N183 Chronic kidney disease, stage 3 unspecified: Secondary | ICD-10-CM | POA: Diagnosis not present

## 2020-03-25 DIAGNOSIS — I2699 Other pulmonary embolism without acute cor pulmonale: Secondary | ICD-10-CM

## 2020-03-25 DIAGNOSIS — J9621 Acute and chronic respiratory failure with hypoxia: Secondary | ICD-10-CM | POA: Diagnosis not present

## 2020-03-25 HISTORY — PX: IR IVC FILTER PLMT / S&I /IMG GUID/MOD SED: IMG701

## 2020-03-25 LAB — BASIC METABOLIC PANEL
Anion gap: 8 (ref 5–15)
BUN: 27 mg/dL — ABNORMAL HIGH (ref 8–23)
CO2: 33 mmol/L — ABNORMAL HIGH (ref 22–32)
Calcium: 9.2 mg/dL (ref 8.9–10.3)
Chloride: 99 mmol/L (ref 98–111)
Creatinine, Ser: 1.2 mg/dL (ref 0.61–1.24)
GFR calc Af Amer: >60 mL/min (ref >60)
GFR calc non Af Amer: 59 mL/min — ABNORMAL LOW (ref >60)
Glucose, Bld: 129 mg/dL — ABNORMAL HIGH (ref 70–99)
Potassium: 3.7 mmol/L (ref 3.5–5.1)
Sodium: 140 mmol/L (ref 135–145)

## 2020-03-25 MED ORDER — FENTANYL CITRATE (PF) 100 MCG/2ML IJ SOLN
INTRAMUSCULAR | Status: AC | PRN
  Administered 2020-03-25 (×2): 25 ug via INTRAVENOUS

## 2020-03-25 MED ORDER — MIDAZOLAM HCL 2 MG/2ML IJ SOLN
INTRAMUSCULAR | Status: AC | PRN
  Administered 2020-03-25 (×3): 0.5 mg via INTRAVENOUS

## 2020-03-25 MED ORDER — IOHEXOL 300 MG/ML  SOLN
100.0000 mL | Freq: Once | INTRAMUSCULAR | Status: AC | PRN
  Administered 2020-03-25: 55 mL via INTRAVENOUS

## 2020-03-25 MED ORDER — CHLORHEXIDINE GLUCONATE 4 % EX LIQD
CUTANEOUS | Status: AC
  Filled 2020-03-25: qty 15

## 2020-03-25 MED ORDER — FENTANYL CITRATE (PF) 100 MCG/2ML IJ SOLN
INTRAMUSCULAR | Status: AC
  Filled 2020-03-25: qty 2

## 2020-03-25 MED ORDER — IOHEXOL 240 MG/ML SOLN: 100.0000 mL | Freq: Once | INTRAMUSCULAR | Status: DC | PRN

## 2020-03-25 MED ORDER — LIDOCAINE HCL 1 % IJ SOLN
INTRAMUSCULAR | Status: AC
  Filled 2020-03-25: qty 20

## 2020-03-25 MED ORDER — MIDAZOLAM HCL 2 MG/2ML IJ SOLN
INTRAMUSCULAR | Status: AC
  Filled 2020-03-25: qty 2

## 2020-03-25 MED ORDER — LIDOCAINE HCL (PF) 1 % IJ SOLN
INTRAMUSCULAR | Status: AC | PRN
  Administered 2020-03-25: 5 mL

## 2020-03-25 NOTE — Procedures (Signed)
Interventional Radiology Procedure Note  Procedure: Placement of potentially retrievable IVC filter.   Complications: None  Estimated Blood Loss: None  Recommendations: - Will follow for potential retrieval    Signed,  Sterling Big, MD

## 2020-03-25 NOTE — Progress Notes (Signed)
Melinda Alewine received call from [employer] asking about patients Fentanyl patch.  Fentanyl patch was on chest  At beginning of case per Teofilo Pod RN.  Pt draped for procedure.  Per [facility] patch missing,  Patch presumably was pulled off with drape

## 2020-03-25 NOTE — Consult Note (Addendum)
Infectious Disease Consultation   Russell Rios  XBD:[id number]  DOB: Oct 11, 1946  DOA: 03/02/2020  Requesting physician: Dr. Sharyon Medicus  Reason for consultation: Antibiotic recommendations   History of Present Illness: Russell Rios is an 74 y.o. male with history of abdominal aortic aneurysm status post graft repair, hypogastric artery coil embolization, chronic kidney disease stage III, history of tobacco abuse, hyperlipidemia, hypertension, coronary artery disease with history of MI who initially presented to the outside facility due to fatigue/malaise/worsening shortness of breath, diarrhea and abdominal pain.  He initially was hypoxemic and required nonrebreather mask.  He was also found to be in atrial fibrillation with RVR.  Chest x-ray showed bilateral infiltrates.  Covid was positive.  He was maintained on high flow nasal cannula for several weeks.  However, he eventually progressed to needing intubation on 01/26/2020.  He failed to make any durable progress from the ventilation weaning and had to undergo trach and PEG placement.  During his hospitalization he developed recurrent/persistent pseudomonal pneumonia and he was treated initially with meropenem for 10 days.  He continued to have recurrent fevers and second dose of amikacin/ciprofloxacin was given.  He was treated with ciprofloxacin until 01/12/2020.  Due to his complex medical problems [facility].  Patient at this time continues to be on ventilator.  He is on 40% FiO2 5 PEEP.  He is complaining of some shortness of breath.  He is nonverbal but nods when asked questions.  Denies having any fevers, chest pain, nausea, vomiting, diarrhea or dysuria at this time.  Respiratory cultures still showing Pseudomonas.  He had CTA of the chest that showed subsegmental pulmonary emboli over the posterior left lower lobe.  He also had underlying emphysema and fibrotic change with airspace consolidation in  the left upper lobe/apex with mixed interstitial airspace disease throughout the lungs suggesting superimposed infection on chronic interstitial disease.  He underwent IVC filter placement today.  Review of Systems:  Review of system negative except as mentioned above in the HPI.   Past Medical History: Past Medical History:  Diagnosis Date  . Acute on chronic respiratory failure with hypoxia (HCC)   . Chronic atrial fibrillation with rapid ventricular response (HCC)   . Chronic kidney disease, stage III (moderate)   . COVID-19 virus infection   . Healthcare-associated pneumonia   AAA (abdominal aortic aneurysm) (*)  AORTIC ABDOMINAL ANEURYSM ENDOVASCULAR REPAIR WITH GORE GRAFT 09/19/17 by Dr. Florentina Addison  . Abnormal CT of the abdomen 10/16/14  1.5 cm posterior right hepatic lobe lesion and 1.8 centimeter exophytic hyperdensity left kidney.  Marland Kitchen BPH (benign prostatic hypertrophy)  with asymptomatic pyuria seen by Adriana Simas 9/97  . Calcium oxalate crystals in urine 04/27/2017  . Chest pain  with cardiac cath normal 2/94. Dr Leeann Must  . CKD (chronic kidney disease), stage II  . Family history of Alzheimer's disease  Brother who is 86. Please see OV 06/08/14  . History of tobacco use  . Hypercholesterolemia  . Hypertension  . Syncope, tussive  post tussive   Past Surgical History: Past Surgical History:  Procedure Laterality Date  . IR IVC FILTER PLMT / S&I /IMG GUID/MOD SED  03/25/2020  Aortic abdominal aneurysm endovascular repair with gore graft 09/19/2017  . Appendectomy laparoscopic, intra operative ultrasound 10/16/14  . Coil embolization lt hypogastric art 09/05/2017  . Knee arthroscopy Left 1992    Allergies:   Allergies  Allergen Reactions  . Atorvastatin   .  Lisinopril     angioedema  . Simvastatin Rash     Social History: Smoking status: Former Smoker  Types: Cigars  . Smokeless tobacco: Never Used  . Tobacco comment: 4 cigars daily  Substance Use Topics  . Alcohol  use: No  . Drug use: No     Family History: Heart disease Mother  CHF  . Hypertension Mother  . Diabetes Mother  . Kidney disease Mother  . Heart disease Father  . Cancer Sister 59  Lung Ca 2013  . Cancer Brother  Prostate Ca  . Dementia Brother     Physical Exam: Vitals: Temperature 98.1, pulse 71, respiratory rate 18, blood pressure 136/84, pulse oximetry 95% on 40% FiO2, PEEP of 5  Constitutional: Ill-appearing male, awake, not in any acute distress at this time. Head: Atraumatic, normocephalic Eyes: PERLA, EOMI, irises appear normal, anicteric sclera,  ENMT: external ears and nose appear normal, normal hearing, Lips appears normal, moist oral mucosa Neck: has trach in place  CVS: S1-S2 clear, no murmur   Respiratory: Rhonchi, no wheezing Abdomen: soft nontender, nondistended, normal bowel sounds, no hepatosplenomegaly, no hernias  Musculoskeletal: Pedal edema Neuro: He has debility with generalized weakness otherwise grossly nonfocal Psych: stable mood and affect, mental status Skin: Sacrococcygeal pressure ulcer, unstageable  Data reviewed:  I have personally reviewed following labs and imaging studies Labs:  CBC: Recent Labs  Lab 03/22/20 0654 03/22/20 1700 03/23/20 0504 03/23/20 1522 03/24/20 0727  WBC 6.9 6.7 6.2 6.4 7.5  HGB 6.9* 8.8* 7.9* 8.1* 8.2*  HCT 23.6* 28.7* 26.0* 27.7* 28.0*  MCV 99.6 96.6 97.0 97.5 97.9  PLT 209 216 236 251 283    Basic Metabolic Panel: Recent Labs  Lab 03/19/20 0542 03/19/20 0542 03/22/20 0654 03/22/20 0654 03/23/20 0504 03/25/20 0626  NA 142  --  141  --  140 140  K 3.7   < > 3.6   < > 3.5 3.7  CL 103  --  101  --  100 99  CO2 31  --  31  --  29 33*  GLUCOSE 128*  --  136*  --  123* 129*  BUN 35*  --  39*  --  38* 27*  CREATININE 1.16  --  1.31*  --  1.32* 1.20  CALCIUM 9.1  --  9.0  --  9.0 9.2   < > = values in this interval not displayed.   GFR CrCl cannot be calculated (Unknown ideal weight.). Liver  Function Tests: No results for input(s): AST, ALT, ALKPHOS, BILITOT, PROT, ALBUMIN in the last 168 hours. No results for input(s): LIPASE, AMYLASE in the last 168 hours. No results for input(s): AMMONIA in the last 168 hours. Coagulation profile No results for input(s): INR, PROTIME in the last 168 hours.  Cardiac Enzymes: No results for input(s): CKTOTAL, CKMB, CKMBINDEX, TROPONINI in the last 168 hours. BNP: Invalid input(s): POCBNP CBG: No results for input(s): GLUCAP in the last 168 hours. D-Dimer No results for input(s): DDIMER in the last 72 hours. Hgb A1c No results for input(s): HGBA1C in the last 72 hours. Lipid Profile No results for input(s): CHOL, HDL, LDLCALC, TRIG, CHOLHDL, LDLDIRECT in the last 72 hours. Thyroid function studies No results for input(s): TSH, T4TOTAL, T3FREE, THYROIDAB in the last 72 hours.  Invalid input(s): FREET3 Anemia work up No results for input(s): VITAMINB12, FOLATE, FERRITIN, TIBC, IRON, RETICCTPCT in the last 72 hours. Urinalysis No results found for: COLORURINE, APPEARANCEUR, LABSPEC, PHURINE, GLUCOSEU, HGBUR, BILIRUBINUR,  Vance Gather, UROBILINOGEN, NITRITE, LEUKOCYTESUR   Microbiology Recent Results (from the past 240 hour(s))  Culture, respiratory     Status: None (Preliminary result)   Collection Time: 03/19/20  1:35 PM   Specimen: Tracheal Aspirate  Result Value Ref Range Status   Specimen Description TRACHEAL ASPIRATE  Final   Special Requests NONE  Final   Gram Stain   Final    NO WBC SEEN FEW SQUAMOUS EPITHELIAL CELLS PRESENT NO ORGANISMS SEEN    Culture   Final    ABUNDANT PSEUDOMONAS AERUGINOSA TO BE SENT OUT FOR FURTHER TESTING Performed at [facility], [address], [place], [place] [address]    Report Status PENDING  Incomplete   Organism ID, Bacteria PSEUDOMONAS AERUGINOSA  Final      Susceptibility   Pseudomonas aeruginosa - MIC*    CEFTAZIDIME >=64 RESISTANT Resistant     CIPROFLOXACIN >=4  RESISTANT Resistant     GENTAMICIN 4 SENSITIVE Sensitive     IMIPENEM >=16 RESISTANT Resistant     CEFEPIME >=64 RESISTANT Resistant     * ABUNDANT PSEUDOMONAS AERUGINOSA  Culture, respiratory (non-expectorated)     Status: None   Collection Time: 03/20/20 10:17 AM   Specimen: Tracheal Aspirate; Respiratory  Result Value Ref Range Status   Specimen Description TRACHEAL ASPIRATE  Final   Special Requests NONE  Final   Gram Stain   Final    NO WBC SEEN RARE GRAM POSITIVE COCCI IN PAIRS FEW GRAM VARIABLE ROD Performed at [facility], [address] [place], [place] [address]    Culture ABUNDANT PSEUDOMONAS AERUGINOSA  Final   Report Status 03/22/2020 FINAL  Final   Organism ID, Bacteria PSEUDOMONAS AERUGINOSA  Final      Susceptibility   Pseudomonas aeruginosa - MIC*    CEFTAZIDIME >=64 RESISTANT Resistant     CIPROFLOXACIN 2 INTERMEDIATE Intermediate     GENTAMICIN 8 INTERMEDIATE Intermediate     IMIPENEM >=16 RESISTANT Resistant     CEFEPIME >=64 RESISTANT Resistant     * ABUNDANT PSEUDOMONAS AERUGINOSA       Inpatient Medications:    Please see MAR   Radiological Exams on Admission: CT ANGIO CHEST PE W OR WO CONTRAST  Addendum Date: 03/24/2020   ADDENDUM REPORT: 03/24/2020 15:22 ADDENDUM: Critical Value/emergent results were called by telephone at the time of interpretation on 03/24/2020 at 3:15 p.m. to provider Dr. Sharyon Medicus, who verbally acknowledged these results. Electronically Signed   By: Elberta Fortis M.D.   On: 03/24/2020 15:22   Result Date: 03/24/2020 CLINICAL DATA:  Hemoptysis.  Evaluate for pulmonary embolism. EXAM: CT ANGIOGRAPHY CHEST WITH CONTRAST TECHNIQUE: Multidetector CT imaging of the chest was performed using the standard protocol during bolus administration of intravenous contrast. Multiplanar CT image reconstructions and MIPs were obtained to evaluate the vascular anatomy. CONTRAST:  55mL OMNIPAQUE IOHEXOL 350 MG/ML SOLN COMPARISON:  Chest x-ray  03/22/2020 FINDINGS: Cardiovascular: Heart is borderline enlarged. Calcified plaque over the left main and 3 vessel coronary arteries. Mild ectasia of the ascending thoracic aorta measuring 3.8 cm in AP diameter. Normal takeoff of the great vessels. There is mild calcified plaque over the descending thoracic aorta with minimal noncalcified mural thrombus. Pulmonary arterial system is well opacified. There are a few small emboli over the posterior basal segment of the left lower lobe. No evidence of right-sided emboli. Elevated RV/LV ratio. Mediastinum/Nodes: Tracheostomy tube is in adequate position. Mild mediastinal adenopathy. 1.2 cm precarinal lymph node. 1.1 cm high  right paramediastinal lymph node. Minimal bilateral hilar adenopathy. Remaining mediastinal structures are unremarkable. Lungs/Pleura: Moderate centrilobular emphysematous disease is present. There is consolidation over the left upper lobe/apex. Findings suggesting patchy fibrotic change over the mid to lower lungs with a few areas of mixed interstitial airspace density. Minimal bronchiectatic change. Upper Abdomen: Calcified plaque over the proximal abdominal aorta. No acute findings. Musculoskeletal: Degenerative change of the spine. Review of the MIP images confirms the above findings. IMPRESSION: 1. A couple subsegmental pulmonary emboli over the posterior left lower lobe. 2. 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 Mild mediastinal and hilar adenopathy likely reactive. 3. Aortic Atherosclerosis (ICD10-I70.0) and Emphysema (ICD10-J43.9). Atherosclerotic coronary artery disease. 4. 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 Recommend annual imaging followup by CTA or MRA. This recommendation follows 2010  ACCF/AHA/AATS/ACR/ASA/SCA/SCAI/SIR/STS/SVM Guidelines for the Diagnosis and Management of Patients with Thoracic Aortic Disease. Circulation.2010; 121: N629-B284. Aortic aneurysm NOS (ICD10-I71.9). Electronically Signed: By: Marin Olp M.D. On: 03/24/2020 15:11   IR IVC FILTER PLMT / S&I Burke Keels GUID/MOD SED  Result Date: 03/25/2020 INDICATION: 74 year old male with a prior history of COVID-19 pneumonia resulting in respiratory failure and tracheostomy placement. He recently had an episode of hemoptysis and CT imaging identified multifocal small pulmonary emboli. Unfortunately, he also has a recent history of GI bleeding and is therefore not an optimal candidate for anticoagulation. He is a candidate for caval interruption for further PE prophylaxis. He presents for placement of a potentially retrievable IVC filter. EXAM: IVC filter placement MEDICATIONS: None. ANESTHESIA/SEDATION: 1.5 mg IV Versed; 50 mcg IV Fentanyl Moderate Sedation Time:  13 minutes The patient was continuously monitored during the procedure by the interventional radiology nurse under my direct supervision. FLUOROSCOPY TIME:  Fluoroscopy Time: 1 minutes 6 seconds (33 mGy). COMPLICATIONS: None immediate. PROCEDURE: Informed written consent was obtained from the patient after a thorough discussion of the procedural risks, benefits and alternatives. All questions were addressed. Maximal Sterile Barrier Technique was utilized including caps, mask, sterile gowns, sterile gloves, sterile drape, hand hygiene and skin antiseptic. A timeout was performed prior to the initiation of the procedure. The right internal jugular vein was interrogated with ultrasound and found to be widely patent. An image was obtained and stored for the medical record. Local anesthesia was attained by infiltration with 1% lidocaine. A small dermatotomy was made. Under real-time sonographic guidance, the vessel was punctured with a 21 gauge micropuncture needle. Using  standard technique, the initial micro needle was exchanged over a 0.018 micro wire for a transitional 4 [place] micro sheath. The micro sheath was then exchanged over a 0.035 wire for a fascial dilator and the skin tract was dilated to 11 [place]. The filter placement sheath was then advanced over the wire into the distal inferior vena cava. A vena cavagram was performed. No evidence of caval thrombus. Cava is normal in caliber. The renal veins were identified. A Bard Denali potentially retrievable inferior vena cava filter was then deployed in infrarenal location. The filter deployed easily and without complication. The sheath was removed and hemostasis attained with gentle manual pressure. IMPRESSION: Successful placement of Bard Denali IVC filter in an infrarenal location. PLAN: This IVC filter is potentially retrievable. The patient will be assessed for filter retrieval by Interventional Radiology in approximately 8-12 weeks. Further recommendations regarding filter retrieval, continued surveillance or declaration of device permanence,  will be made at that time. Electronically Signed   By: Malachy MoanHeath  McCullough M.D.   On: 03/25/2020 14:43    Impression/Recommendations Active Problems:   Acute on chronic respiratory failure with hypoxia (HCC)   COVID-19 virus infection   Chronic atrial fibrillation with rapid ventricular response (HCC)   Chronic kidney disease, stage III (moderate)   Healthcare-associated pneumonia Pneumonia with multidrug-resistant Pseudomonas aeruginosa Ventilator dependent respiratory failure Pulmonary embolus GI bleed  Atrial fibrillation Debility with generalized weakness Dysphagia Protein calorie malnutrition Chronic kidney disease stage III with acute kidney injury Unstageable sacrococcygeal pressure ulcer  Acute on chronic respiratory failure with hypoxemia: Multifactorial etiology.  Patient initially had COVID-19 infection and subsequently had superimposed bacterial  pneumonia with Pseudomonas aeruginosa.  The Pseudomonas is multidrug-resistant.  He recently had respiratory cultures which again continued to show the Pseudomonas which is unfortunately resistant to all antibiotics.  Currently no choice but to start him on gentamicin.  Will also add ciprofloxacin which is intermediate susceptibility.  We will plan to treat for duration of 7 to 10 days pending improvement. Please monitor BUN/creatinine very closely while on antibiotics.  If he has worsening renal failure then will have to discontinue the gentamicin.  He remains on the ventilator.  Pulmonary following.  Further management per pulmonary and the primary team.  Pneumonia: As mentioned above respiratory culture showing multidrug regimen Pseudomonas aeruginosa susceptible to gentamicin therefore no choice but to start him on gentamicin.  We will also order ciprofloxacin for double coverage since it is intermediate to the ciprofloxacin.  He also has dysphagia and high risk for aspiration therefore at risk for worsening respiratory failure, worsening pneumonia secondary to aspiration.  COVID-19 infection: He was already treated at the outside facility.  Continue management per the primary team.  GI bleed: Patient reportedly had GI bleed and had to be transfused 1 unit of PRBCs on 03/22/2020.  On IV Protonix.  Pulmonary embolus: Patient had CTA that showed subsegmental pulmonary emboli over the posterior left lower lobe.  He is status post IVC filter placement.  Further management per primary team.  Acute on chronic renal failure: Patient likely has baseline CKD stage III.  Continue to monitor BUN/trending closely while on antibiotics.  Unfortunately due to the multidrug-resistant Pseudomonas no choice but to treat with gentamicin.  Adding ciprofloxacin as mentioned above.  Avoid nephrotoxic medications.  If his creatinine continues to worsen then we may need to consider stopping the gentamicin.  Dysphagia/protein  calorie malnutrition: Continue management per the primary team.  Due to his dysphagia he is at risk for aspiration and worsening respiratory secondary to aspiration pneumonia.  Atrial fibrillation: Continue medication and management per the primary team.  Debility with generalized weakness: Continue supportive management per the primary team.  Unstageable sacrococcygeal pressure ulcer: Continue local wound care.  Due to his debility is high risk for worsening of the wound.  Due to his multiple complex medical problems he is high risk for worsening and decompensation.   Thank you for this consultation.   Plan of care discussed with the primary team and pharmacy.  Vonzella NippleAnupama Anson Peddie M.D. 03/25/2020, 5:04 PM

## 2020-03-25 NOTE — Progress Notes (Signed)
Pulmonary Critical Care Medicine [facility] [place]   PULMONARY CRITICAL CARE SERVICE  PROGRESS NOTE  Date of Service: 03/25/2020  Russell Rios  QPY:[id number]  DOB: [birth year]   DOA: 03/02/2020  Referring Physician: Carron Curie, MD  HPI: Russell Rios is a 74 y.o. male seen for follow up of Acute on Chronic Respiratory Failure.  Patient currently is on full support on pressure control mode has been on 40% FiO2 with an IPAP 12 currently is on a PEEP of 5  Medications: Reviewed on Rounds  Physical Exam:  Vitals: Temperature is 99.0 pulse 79 respiratory 24 blood pressure is 115/55 saturations 98%  Ventilator Settings on pressure assist control FiO2 40% IP 12 PEEP 5  . General: Comfortable at this time . Eyes: Grossly normal lids, irises & conjunctiva . ENT: grossly tongue is normal . Neck: no obvious mass . Cardiovascular: S1 S2 normal no gallop . Respiratory: No rhonchi coarse breath sounds are noted at this time . Abdomen: soft . Skin: no rash seen on limited exam . Musculoskeletal: not rigid . Psychiatric:unable to assess . Neurologic: no seizure no involuntary movements         Lab Data:   Basic Metabolic Panel: Recent Labs  Lab 03/19/20 0542 03/22/20 0654 03/23/20 0504 03/25/20 0626  NA 142 141 140 140  K 3.7 3.6 3.5 3.7  CL 103 101 100 99  CO2 31 31 29  33*  GLUCOSE 128* 136* 123* 129*  BUN 35* 39* 38* 27*  CREATININE 1.16 1.31* 1.32* 1.20  CALCIUM 9.1 9.0 9.0 9.2    ABG: Recent Labs  Lab 03/23/20 1405  PHART 7.451*  PCO2ART 46.1  PO2ART 44.9*  HCO3 31.7*  O2SAT 81.1    Liver Function Tests: No results for input(s): AST, ALT, ALKPHOS, BILITOT, PROT, ALBUMIN in the last 168 hours. No results for input(s): LIPASE, AMYLASE in the last 168 hours. No results for input(s): AMMONIA in the last 168 hours.  CBC: Recent Labs  Lab 03/22/20 0654 03/22/20 1700 03/23/20 0504 03/23/20 1522 03/24/20 0727  WBC 6.9 6.7 6.2 6.4 7.5  HGB 6.9*  8.8* 7.9* 8.1* 8.2*  HCT 23.6* 28.7* 26.0* 27.7* 28.0*  MCV 99.6 96.6 97.0 97.5 97.9  PLT 209 216 236 251 283    Cardiac Enzymes: No results for input(s): CKTOTAL, CKMB, CKMBINDEX, TROPONINI in the last 168 hours.  BNP (last 3 results) No results for input(s): BNP in the last 8760 hours.  ProBNP (last 3 results) No results for input(s): PROBNP in the last 8760 hours.  Radiological Exams: CT ANGIO CHEST PE W OR WO CONTRAST  Addendum Date: 03/24/2020   ADDENDUM REPORT: 03/24/2020 15:22 ADDENDUM: Critical Value/emergent results were called by telephone at the time of interpretation on 03/24/2020 at 3:15 p.m. to provider Dr. 05/24/2020, who verbally acknowledged these results. Electronically Signed   By: Sharyon Medicus M.D.   On: 03/24/2020 15:22   Result Date: 03/24/2020 CLINICAL DATA:  Hemoptysis.  Evaluate for pulmonary embolism. EXAM: CT ANGIOGRAPHY CHEST WITH CONTRAST TECHNIQUE: Multidetector CT imaging of the chest was performed using the standard protocol during bolus administration of intravenous contrast. Multiplanar CT image reconstructions and MIPs were obtained to evaluate the vascular anatomy. CONTRAST:  69mL OMNIPAQUE IOHEXOL 350 MG/ML SOLN COMPARISON:  Chest x-ray 03/22/2020 FINDINGS: Cardiovascular: Heart is borderline enlarged. Calcified plaque over the left main and 3 vessel coronary arteries. Mild ectasia of the ascending thoracic aorta measuring 3.8 cm in AP diameter. Normal takeoff of the great vessels. There is  mild calcified plaque over the descending thoracic aorta with minimal noncalcified mural thrombus. Pulmonary arterial system is well opacified. There are a few small emboli over the posterior basal segment of the left lower lobe. No evidence of right-sided emboli. Elevated RV/LV ratio. Mediastinum/Nodes: Tracheostomy tube is in adequate position. Mild mediastinal adenopathy. 1.2 cm precarinal lymph node. 1.1 cm high right paramediastinal lymph node. Minimal bilateral hilar  adenopathy. Remaining mediastinal structures are unremarkable. Lungs/Pleura: Moderate centrilobular emphysematous disease is present. There is consolidation over the left upper lobe/apex. Findings suggesting patchy fibrotic change over the mid to lower lungs with a few areas of mixed interstitial airspace density. Minimal bronchiectatic change. Upper Abdomen: Calcified plaque over the proximal abdominal aorta. No acute findings. Musculoskeletal: Degenerative change of the spine. Review of the MIP images confirms the above findings. IMPRESSION: 1. A couple subsegmental pulmonary emboli over the posterior left lower lobe. 2. 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 Mild mediastinal and hilar adenopathy likely reactive. 3. Aortic Atherosclerosis (ICD10-I70.0) and Emphysema (ICD10-J43.9). Atherosclerotic coronary artery disease. 4. 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 Recommend annual imaging followup by CTA or MRA. This recommendation follows 2010 ACCF/AHA/AATS/ACR/ASA/SCA/SCAI/SIR/STS/SVM Guidelines for the Diagnosis and Management of Patients with Thoracic Aortic Disease. Circulation.2010; 121: R678-L381. Aortic aneurysm NOS (ICD10-I71.9). Electronically Signed: By: Marin Olp M.D. On: 03/24/2020 15:11    Assessment/Plan Active Problems:   Acute on chronic respiratory failure with hypoxia (HCC)   COVID-19 virus infection   Chronic atrial fibrillation with rapid ventricular response (HCC)   Chronic kidney disease, stage III (moderate)   Healthcare-associated pneumonia   1. Acute on chronic respiratory failure with hypoxia we will continue with full support on pressure control patient's mechanics are poor not tolerating weaning.  Patient had a CT scan done of the chest which showed  subsegmental pulmonary emboli along with fibrotic changes and airspace consolidation. 2. COVID-19 virus infection resolution phase we will continue with supportive care. 3. Pulmonary embolism patient is not a candidate for anticoagulation was evaluated by radiology and will have the IVC filter done 4. Chronic atrial fibrillation rate now rate controlled 5. Chronic kidney disease stage III we will continue to monitor 6. Healthcare associated pneumonia treated we will continue with supportive care   I have personally seen and evaluated the patient, evaluated laboratory and imaging results, formulated the assessment and plan and placed orders. The Patient requires high complexity decision making with multiple systems involvement.  Rounds were done with the Respiratory Therapy Director and Staff therapists and discussed with nursing staff also.  Allyne Gee, MD [facility] Pulmonary Critical Care Medicine Sleep Medicine

## 2020-03-26 ENCOUNTER — Institutional Professional Consult (permissible substitution) (HOSPITAL_BASED_OUTPATIENT_CLINIC_OR_DEPARTMENT_OTHER): Payer: Medicare Other

## 2020-03-26 ENCOUNTER — Other Ambulatory Visit (HOSPITAL_COMMUNITY): Payer: Medicare Other

## 2020-03-26 DIAGNOSIS — U071 COVID-19: Secondary | ICD-10-CM | POA: Diagnosis not present

## 2020-03-26 DIAGNOSIS — I2699 Other pulmonary embolism without acute cor pulmonale: Secondary | ICD-10-CM

## 2020-03-26 DIAGNOSIS — J9621 Acute and chronic respiratory failure with hypoxia: Secondary | ICD-10-CM | POA: Diagnosis not present

## 2020-03-26 DIAGNOSIS — I482 Chronic atrial fibrillation, unspecified: Secondary | ICD-10-CM | POA: Diagnosis not present

## 2020-03-26 DIAGNOSIS — N183 Chronic kidney disease, stage 3 unspecified: Secondary | ICD-10-CM | POA: Diagnosis not present

## 2020-03-26 NOTE — Progress Notes (Signed)
Bilateral lower extremity venous duplex has been completed. Preliminary results can be found in CV Proc through chart review.  Results were given to the patient's nurse, Perlie Gold.  03/26/20 1:46 PM Olen Cordial RVT

## 2020-03-26 NOTE — Progress Notes (Addendum)
Pulmonary Critical Care Medicine [facility] [place]   PULMONARY CRITICAL CARE SERVICE  PROGRESS NOTE  Date of Service: 03/26/2020  Russell Rios  [id number]:[id number]  DOB: July 14, 1946   DOA: 03/02/2020  Referring Physician: Carron Curie, MD  HPI: Russell Rios is a 74 y.o. male seen for follow up of Acute on Chronic Respiratory Failure.  Patient continues on 40% aerosol trach collar at this time is a 24-hour goal.  Satting well this time no distress.  Medications: Reviewed on Rounds  Physical Exam:  Vitals: Pulse 74 respirations 24 BP 102/56 O2 sat 100% temp 97.9  Ventilator Settings ATC 40%  . General: Comfortable at this time . Eyes: Grossly normal lids, irises & conjunctiva . ENT: grossly tongue is normal . Neck: no obvious mass . Cardiovascular: S1 S2 normal no gallop . Respiratory: No rales or rhonchi noted . Abdomen: soft . Skin: no rash seen on limited exam . Musculoskeletal: not rigid . Psychiatric:unable to assess . Neurologic: no seizure no involuntary movements         Lab Data:   Basic Metabolic Panel: Recent Labs  Lab 03/22/20 0654 03/23/20 0504 03/25/20 0626  NA 141 140 140  K 3.6 3.5 3.7  CL 101 100 99  CO2 31 29 33*  GLUCOSE 136* 123* 129*  BUN 39* 38* 27*  CREATININE 1.31* 1.32* 1.20  CALCIUM 9.0 9.0 9.2    ABG: Recent Labs  Lab 03/23/20 1405  PHART 7.451*  PCO2ART 46.1  PO2ART 44.9*  HCO3 31.7*  O2SAT 81.1    Liver Function Tests: No results for input(s): AST, ALT, ALKPHOS, BILITOT, PROT, ALBUMIN in the last 168 hours. No results for input(s): LIPASE, AMYLASE in the last 168 hours. No results for input(s): AMMONIA in the last 168 hours.  CBC: Recent Labs  Lab 03/22/20 0654 03/22/20 1700 03/23/20 0504 03/23/20 1522 03/24/20 0727  WBC 6.9 6.7 6.2 6.4 7.5  HGB 6.9* 8.8* 7.9* 8.1* 8.2*  HCT 23.6* 28.7* 26.0* 27.7* 28.0*  MCV 99.6 96.6 97.0 97.5 97.9  PLT 209 216 236 251 283    Cardiac Enzymes: No results for  input(s): CKTOTAL, CKMB, CKMBINDEX, TROPONINI in the last 168 hours.  BNP (last 3 results) No results for input(s): BNP in the last 8760 hours.  ProBNP (last 3 results) No results for input(s): PROBNP in the last 8760 hours.  Radiological Exams: IR IVC FILTER PLMT / S&I Lenise Arena GUID/MOD SED  Result Date: 03/25/2020 INDICATION: 74 year old male with a prior history of COVID-19 pneumonia resulting in respiratory failure and tracheostomy placement. He recently had an episode of hemoptysis and CT imaging identified multifocal small pulmonary emboli. Unfortunately, he also has a recent history of GI bleeding and is therefore not an optimal candidate for anticoagulation. He is a candidate for caval interruption for further PE prophylaxis. He presents for placement of a potentially retrievable IVC filter. EXAM: IVC filter placement MEDICATIONS: None. ANESTHESIA/SEDATION: 1.5 mg IV Versed; 50 mcg IV Fentanyl Moderate Sedation Time:  13 minutes The patient was continuously monitored during the procedure by the interventional radiology nurse under my direct supervision. FLUOROSCOPY TIME:  Fluoroscopy Time: 1 minutes 6 seconds (33 mGy). COMPLICATIONS: None immediate. PROCEDURE: Informed written consent was obtained from the patient after a thorough discussion of the procedural risks, benefits and alternatives. All questions were addressed. Maximal Sterile Barrier Technique was utilized including caps, mask, sterile gowns, sterile gloves, sterile drape, hand hygiene and skin antiseptic. A timeout was performed prior to the initiation of the procedure.  The right internal jugular vein was interrogated with ultrasound and found to be widely patent. An image was obtained and stored for the medical record. Local anesthesia was attained by infiltration with 1% lidocaine. A small dermatotomy was made. Under real-time sonographic guidance, the vessel was punctured with a 21 gauge micropuncture needle. Using standard technique,  the initial micro needle was exchanged over a 0.018 micro wire for a transitional 4 [place] micro sheath. The micro sheath was then exchanged over a 0.035 wire for a fascial dilator and the skin tract was dilated to 11 [place]. The filter placement sheath was then advanced over the wire into the distal inferior vena cava. A vena cavagram was performed. No evidence of caval thrombus. Cava is normal in caliber. The renal veins were identified. A Bard Denali potentially retrievable inferior vena cava filter was then deployed in infrarenal location. The filter deployed easily and without complication. The sheath was removed and hemostasis attained with gentle manual pressure. IMPRESSION: Successful placement of Bard Denali IVC filter in an infrarenal location. PLAN: This IVC filter is potentially retrievable. The patient will be assessed for filter retrieval by Interventional Radiology in approximately 8-12 weeks. Further recommendations regarding filter retrieval, continued surveillance or declaration of device permanence, will be made at that time. Electronically Signed   By: Malachy Moan M.D.   On: 03/25/2020 14:43   DG CHEST PORT 1 VIEW  Result Date: 03/26/2020 CLINICAL DATA:  Congestive heart failure EXAM: PORTABLE CHEST 1 VIEW COMPARISON:  Mar 22, 2020 FINDINGS: The tracheostomy tube is stable in positioning. Diffuse bilateral coarse airspace opacities are noted. There is no pneumothorax. No significant pleural effusion. The heart size is stable. Aortic calcifications are noted. IMPRESSION: Stable appearance of the chest with persistent diffuse bilateral coarse airspace opacities. Electronically Signed   By: Katherine Mantle M.D.   On: 03/26/2020 18:18   VAS [place] LOWER EXTREMITY VENOUS (DVT)  Result Date: 03/26/2020  Lower Venous DVTStudy Indications: Pulmonary embolism.  Risk Factors: None identified. Limitations: Body habitus, poor ultrasound/tissue interface and patient positioning. Comparison Study: No  prior studies. Performing Technologist: Chanda Busing RVT  Examination Guidelines: A complete evaluation includes B-mode imaging, spectral Doppler, color Doppler, and power Doppler as needed of all accessible portions of each vessel. Bilateral testing is considered an integral part of a complete examination. Limited examinations for reoccurring indications may be performed as noted. The reflux portion of the exam is performed with the patient in reverse Trendelenburg.  +---------+---------------+---------+-----------+----------+--------------+ RIGHT    CompressibilityPhasicitySpontaneityPropertiesThrombus Aging +---------+---------------+---------+-----------+----------+--------------+ CFV      Full           Yes      Yes                                 +---------+---------------+---------+-----------+----------+--------------+ SFJ      Full                                                        +---------+---------------+---------+-----------+----------+--------------+ FV Prox  Full                                                        +---------+---------------+---------+-----------+----------+--------------+  FV Mid   Full                                                        +---------+---------------+---------+-----------+----------+--------------+ FV DistalFull                                                        +---------+---------------+---------+-----------+----------+--------------+ PFV      Full                                                        +---------+---------------+---------+-----------+----------+--------------+ POP      Full           Yes      Yes                                 +---------+---------------+---------+-----------+----------+--------------+ PTV      Partial                                      Acute          +---------+---------------+---------+-----------+----------+--------------+ PERO     Partial                                       Acute          +---------+---------------+---------+-----------+----------+--------------+ Gastroc  Partial                                      Acute          +---------+---------------+---------+-----------+----------+--------------+   +---------+---------------+---------+-----------+----------+--------------+ LEFT     CompressibilityPhasicitySpontaneityPropertiesThrombus Aging +---------+---------------+---------+-----------+----------+--------------+ CFV      Partial        Yes      Yes                  Acute          +---------+---------------+---------+-----------+----------+--------------+ SFJ      Full                                                        +---------+---------------+---------+-----------+----------+--------------+ FV Prox  Full           Yes      Yes                                 +---------+---------------+---------+-----------+----------+--------------+ FV Mid   Full                                                        +---------+---------------+---------+-----------+----------+--------------+  FV DistalFull                                                        +---------+---------------+---------+-----------+----------+--------------+ PFV      Full                                                        +---------+---------------+---------+-----------+----------+--------------+ POP      Full           Yes      Yes                                 +---------+---------------+---------+-----------+----------+--------------+ PTV      Partial                                      Acute          +---------+---------------+---------+-----------+----------+--------------+ PERO     Full                                                        +---------+---------------+---------+-----------+----------+--------------+ EIV      Full           Yes      Yes                                  +---------+---------------+---------+-----------+----------+--------------+     Summary: RIGHT: - Findings consistent with acute deep vein thrombosis involving the right posterior tibial veins, right peroneal veins, and right gastrocnemius veins. - No cystic structure found in the popliteal fossa.  LEFT: - Findings consistent with acute deep vein thrombosis involving the left common femoral vein, and left posterior tibial veins. - No cystic structure found in the popliteal fossa.  *See table(s) above for measurements and observations. Electronically signed by Waverly Ferrari MD on 03/26/2020 at 7:20:37 PM.    Final     Assessment/Plan Active Problems:   Acute on chronic respiratory failure with hypoxia (HCC)   COVID-19 virus infection   Chronic atrial fibrillation with rapid ventricular response (HCC)   Chronic kidney disease, stage III (moderate)   Healthcare-associated pneumonia   1. Acute on chronic respiratory failure with hypoxia patient is currently working on 24-hour goal on aerosol trach collar 40%. 2. COVID-19 virus infection resolution phase we will continue with supportive care. 3. Pulmonary embolism patient is not a candidate for anticoagulation was evaluated by radiology and will have the IVC filter done 4. Chronic atrial fibrillation rate now rate controlled 5. Chronic kidney disease stage III we will continue to monitor 6. Healthcare associated pneumonia treated we will continue with supportive care   I have personally seen and evaluated the patient, evaluated laboratory and imaging results, formulated the assessment and plan and placed orders. The Patient requires high complexity decision  making with multiple systems involvement.  Rounds were done with the Respiratory Therapy Director and Staff therapists and discussed with nursing staff also.  Allyne Gee, MD [facility] Pulmonary Critical Care Medicine Sleep Medicine

## 2020-03-27 ENCOUNTER — Other Ambulatory Visit (HOSPITAL_COMMUNITY): Payer: Medicare Other

## 2020-03-27 DIAGNOSIS — I482 Chronic atrial fibrillation, unspecified: Secondary | ICD-10-CM | POA: Diagnosis not present

## 2020-03-27 DIAGNOSIS — J9621 Acute and chronic respiratory failure with hypoxia: Secondary | ICD-10-CM | POA: Diagnosis not present

## 2020-03-27 DIAGNOSIS — N179 Acute kidney failure, unspecified: Secondary | ICD-10-CM | POA: Diagnosis not present

## 2020-03-27 DIAGNOSIS — N183 Chronic kidney disease, stage 3 unspecified: Secondary | ICD-10-CM | POA: Diagnosis not present

## 2020-03-27 LAB — BASIC METABOLIC PANEL
Anion gap: 10 (ref 5–15)
BUN: 36 mg/dL — ABNORMAL HIGH (ref 8–23)
CO2: 31 mmol/L (ref 22–32)
Calcium: 9.4 mg/dL (ref 8.9–10.3)
Chloride: 97 mmol/L — ABNORMAL LOW (ref 98–111)
Creatinine, Ser: 1.42 mg/dL — ABNORMAL HIGH (ref 0.61–1.24)
GFR calc Af Amer: 56 mL/min — ABNORMAL LOW (ref >60)
GFR calc non Af Amer: 48 mL/min — ABNORMAL LOW (ref >60)
Glucose, Bld: 161 mg/dL — ABNORMAL HIGH (ref 70–99)
Potassium: 3.9 mmol/L (ref 3.5–5.1)
Sodium: 138 mmol/L (ref 135–145)

## 2020-03-27 LAB — CBC
HCT: 26.4 % — ABNORMAL LOW (ref 39.0–52.0)
Hemoglobin: 7.5 g/dL — ABNORMAL LOW (ref 13.0–17.0)
MCH: 27.7 pg (ref 26.0–34.0)
MCHC: 28.4 g/dL — ABNORMAL LOW (ref 30.0–36.0)
MCV: 97.4 fL (ref 80.0–100.0)
Platelets: 313 10*3/uL (ref 150–400)
RBC: 2.71 MIL/uL — ABNORMAL LOW (ref 4.22–5.81)
RDW: 18.3 % — ABNORMAL HIGH (ref 11.5–15.5)
WBC: 8.4 10*3/uL (ref 4.0–10.5)
nRBC: 0.2 % (ref 0.0–0.2)

## 2020-03-27 LAB — BLOOD GAS, ARTERIAL
Acid-Base Excess: 10.3 mmol/L — ABNORMAL HIGH (ref 0.0–2.0)
Bicarbonate: 34.9 mmol/L — ABNORMAL HIGH (ref 20.0–28.0)
FIO2: 50
O2 Saturation: 98.2 %
Patient temperature: 38
pCO2 arterial: 54.7 mmHg — ABNORMAL HIGH (ref 32.0–48.0)
pH, Arterial: 7.426 (ref 7.350–7.450)
pO2, Arterial: 97.7 mmHg (ref 83.0–108.0)

## 2020-03-27 NOTE — Progress Notes (Signed)
Pulmonary Critical Care Medicine [facility] [place]   PULMONARY CRITICAL CARE SERVICE  PROGRESS NOTE  Date of Service: 03/27/2020  Russell Rios  BOF:[id number]  DOB: 05-06-1946   DOA: 03/02/2020  Referring Physician: Carron Curie, MD  HPI: Russell Rios is a 74 y.o. male seen for follow up of Acute on Chronic Respiratory Failure.  Patient is back on the ventilator right now on pressure control mode has been requiring 40% FiO2 saturations are good.  Patient however has not been tolerating spontaneous breathing trial reportedly  Medications: Reviewed on Rounds  Physical Exam:  Vitals: Temperature 98.4 pulse 80 respiratory 24 blood pressure is 127/65 saturations 98%  Ventilator Settings on pressure assist control FiO2 is 40% tidal volume is 728 PEEP 5  . General: Comfortable at this time . Eyes: Grossly normal lids, irises & conjunctiva . ENT: grossly tongue is normal . Neck: no obvious mass . Cardiovascular: S1 S2 normal no gallop . Respiratory: No rhonchi coarse breath sounds are noted . Abdomen: soft . Skin: no rash seen on limited exam . Musculoskeletal: not rigid . Psychiatric:unable to assess . Neurologic: no seizure no involuntary movements         Lab Data:   Basic Metabolic Panel: Recent Labs  Lab 03/22/20 0654 03/23/20 0504 03/25/20 0626 03/27/20 1036  NA 141 140 140 138  K 3.6 3.5 3.7 3.9  CL 101 100 99 97*  CO2 31 29 33* 31  GLUCOSE 136* 123* 129* 161*  BUN 39* 38* 27* 36*  CREATININE 1.31* 1.32* 1.20 1.42*  CALCIUM 9.0 9.0 9.2 9.4    ABG: Recent Labs  Lab 03/23/20 1405  PHART 7.451*  PCO2ART 46.1  PO2ART 44.9*  HCO3 31.7*  O2SAT 81.1    Liver Function Tests: No results for input(s): AST, ALT, ALKPHOS, BILITOT, PROT, ALBUMIN in the last 168 hours. No results for input(s): LIPASE, AMYLASE in the last 168 hours. No results for input(s): AMMONIA in the last 168 hours.  CBC: Recent Labs  Lab 03/22/20 1700 03/23/20 0504  03/23/20 1522 03/24/20 0727 03/27/20 1036  WBC 6.7 6.2 6.4 7.5 8.4  HGB 8.8* 7.9* 8.1* 8.2* 7.5*  HCT 28.7* 26.0* 27.7* 28.0* 26.4*  MCV 96.6 97.0 97.5 97.9 97.4  PLT 216 236 251 283 313    Cardiac Enzymes: No results for input(s): CKTOTAL, CKMB, CKMBINDEX, TROPONINI in the last 168 hours.  BNP (last 3 results) No results for input(s): BNP in the last 8760 hours.  ProBNP (last 3 results) No results for input(s): PROBNP in the last 8760 hours.  Radiological Exams: DG CHEST PORT 1 VIEW  Result Date: 03/26/2020 CLINICAL DATA:  Congestive heart failure EXAM: PORTABLE CHEST 1 VIEW COMPARISON:  Mar 22, 2020 FINDINGS: The tracheostomy tube is stable in positioning. Diffuse bilateral coarse airspace opacities are noted. There is no pneumothorax. No significant pleural effusion. The heart size is stable. Aortic calcifications are noted. IMPRESSION: Stable appearance of the chest with persistent diffuse bilateral coarse airspace opacities. Electronically Signed   By: Katherine Mantle M.D.   On: 03/26/2020 18:18   VAS [place] LOWER EXTREMITY VENOUS (DVT)  Result Date: 03/26/2020  Lower Venous DVTStudy Indications: Pulmonary embolism.  Risk Factors: None identified. Limitations: Body habitus, poor ultrasound/tissue interface and patient positioning. Comparison Study: No prior studies. Performing Technologist: Chanda Busing RVT  Examination Guidelines: A complete evaluation includes B-mode imaging, spectral Doppler, color Doppler, and power Doppler as needed of all accessible portions of each vessel. Bilateral testing is considered an integral  part of a complete examination. Limited examinations for reoccurring indications may be performed as noted. The reflux portion of the exam is performed with the patient in reverse Trendelenburg.  +---------+---------------+---------+-----------+----------+--------------+ RIGHT    CompressibilityPhasicitySpontaneityPropertiesThrombus Aging  +---------+---------------+---------+-----------+----------+--------------+ CFV      Full           Yes      Yes                                 +---------+---------------+---------+-----------+----------+--------------+ SFJ      Full                                                        +---------+---------------+---------+-----------+----------+--------------+ FV Prox  Full                                                        +---------+---------------+---------+-----------+----------+--------------+ FV Mid   Full                                                        +---------+---------------+---------+-----------+----------+--------------+ FV DistalFull                                                        +---------+---------------+---------+-----------+----------+--------------+ PFV      Full                                                        +---------+---------------+---------+-----------+----------+--------------+ POP      Full           Yes      Yes                                 +---------+---------------+---------+-----------+----------+--------------+ PTV      Partial                                      Acute          +---------+---------------+---------+-----------+----------+--------------+ PERO     Partial                                      Acute          +---------+---------------+---------+-----------+----------+--------------+ Gastroc  Partial  Acute          +---------+---------------+---------+-----------+----------+--------------+   +---------+---------------+---------+-----------+----------+--------------+ LEFT     CompressibilityPhasicitySpontaneityPropertiesThrombus Aging +---------+---------------+---------+-----------+----------+--------------+ CFV      Partial        Yes      Yes                  Acute           +---------+---------------+---------+-----------+----------+--------------+ SFJ      Full                                                        +---------+---------------+---------+-----------+----------+--------------+ FV Prox  Full           Yes      Yes                                 +---------+---------------+---------+-----------+----------+--------------+ FV Mid   Full                                                        +---------+---------------+---------+-----------+----------+--------------+ FV DistalFull                                                        +---------+---------------+---------+-----------+----------+--------------+ PFV      Full                                                        +---------+---------------+---------+-----------+----------+--------------+ POP      Full           Yes      Yes                                 +---------+---------------+---------+-----------+----------+--------------+ PTV      Partial                                      Acute          +---------+---------------+---------+-----------+----------+--------------+ PERO     Full                                                        +---------+---------------+---------+-----------+----------+--------------+ EIV      Full           Yes      Yes                                 +---------+---------------+---------+-----------+----------+--------------+  Summary: RIGHT: - Findings consistent with acute deep vein thrombosis involving the right posterior tibial veins, right peroneal veins, and right gastrocnemius veins. - No cystic structure found in the popliteal fossa.  LEFT: - Findings consistent with acute deep vein thrombosis involving the left common femoral vein, and left posterior tibial veins. - No cystic structure found in the popliteal fossa.  *See table(s) above for measurements and observations. Electronically signed by Waverly Ferrari MD on 03/26/2020 at 7:20:37 PM.    Final     Assessment/Plan Active Problems:   Acute on chronic respiratory failure with hypoxia (HCC)   COVID-19 virus infection   Chronic atrial fibrillation with rapid ventricular response (HCC)   Chronic kidney disease, stage III (moderate)   Healthcare-associated pneumonia   1. Acute on chronic respiratory failure hypoxia we will continue with pressure control FiO2 40% good tidal volumes are noted right now with PEEP of 5.  Patient has not been tolerating spontaneous trials we will hold off on doing a wean today.  Patient's wife was in the room she was updated.  I did look at the chest x-ray which shows significant worsening also of the diffuse interstitial pattern 2. COVID-19 virus infection resolved we will continue to monitor closely. 3. Chronic atrial fibrillation rate controlled 4. Chronic kidney disease stage III following labs diuretics as tolerated. 5. Healthcare associated pneumonia patient still has residual changes noted on the chest x-ray   I have personally seen and evaluated the patient, evaluated laboratory and imaging results, formulated the assessment and plan and placed orders. The Patient requires high complexity decision making with multiple systems involvement.  Rounds were done with the Respiratory Therapy Director and Staff therapists and discussed with nursing staff also.  Time 35 minutes  Yevonne Pax, MD [facility] Pulmonary Critical Care Medicine Sleep Medicine

## 2020-03-28 DIAGNOSIS — N183 Chronic kidney disease, stage 3 unspecified: Secondary | ICD-10-CM | POA: Diagnosis not present

## 2020-03-28 DIAGNOSIS — U071 COVID-19: Secondary | ICD-10-CM | POA: Diagnosis not present

## 2020-03-28 DIAGNOSIS — J9621 Acute and chronic respiratory failure with hypoxia: Secondary | ICD-10-CM | POA: Diagnosis not present

## 2020-03-28 DIAGNOSIS — I482 Chronic atrial fibrillation, unspecified: Secondary | ICD-10-CM | POA: Diagnosis not present

## 2020-03-28 NOTE — Progress Notes (Signed)
Pulmonary Critical Care Medicine [facility] [place]   PULMONARY CRITICAL CARE SERVICE  PROGRESS NOTE  Date of Service: 03/28/2020  Russell Rios  IWP:[id number]  DOB: 1945/12/08   DOA: 03/02/2020  Referring Physician: Carron Curie, MD  HPI: Russell Rios is a 74 y.o. male seen for follow up of Acute on Chronic Respiratory Failure.  Patient currently is on pressure assist control has been on 45% FiO2 with good saturations currently is on an IP of 12  Medications: Reviewed on Rounds  Physical Exam:  Vitals: Temperature 98.1 pulse 77 respiratory rate 17 blood pressure is 104/70 saturations 100%  Ventilator Settings on pressure assist control FiO2 45% tidal volume 391 PEEP 8 IP 12  . General: Comfortable at this time . Eyes: Grossly normal lids, irises & conjunctiva . ENT: grossly tongue is normal . Neck: no obvious mass . Cardiovascular: S1 S2 normal no gallop . Respiratory: Coarse breath sounds with few scattered rhonchi are noted . Abdomen: soft . Skin: no rash seen on limited exam . Musculoskeletal: not rigid . Psychiatric:unable to assess . Neurologic: no seizure no involuntary movements         Lab Data:   Basic Metabolic Panel: Recent Labs  Lab 03/22/20 0654 03/23/20 0504 03/25/20 0626 03/27/20 1036  NA 141 140 140 138  K 3.6 3.5 3.7 3.9  CL 101 100 99 97*  CO2 31 29 33* 31  GLUCOSE 136* 123* 129* 161*  BUN 39* 38* 27* 36*  CREATININE 1.31* 1.32* 1.20 1.42*  CALCIUM 9.0 9.0 9.2 9.4    ABG: Recent Labs  Lab 03/23/20 1405 03/27/20 1930  PHART 7.451* 7.426  PCO2ART 46.1 54.7*  PO2ART 44.9* 97.7  HCO3 31.7* 34.9*  O2SAT 81.1 98.2    Liver Function Tests: No results for input(s): AST, ALT, ALKPHOS, BILITOT, PROT, ALBUMIN in the last 168 hours. No results for input(s): LIPASE, AMYLASE in the last 168 hours. No results for input(s): AMMONIA in the last 168 hours.  CBC: Recent Labs  Lab 03/22/20 1700 03/23/20 0504 03/23/20 1522  03/24/20 0727 03/27/20 1036  WBC 6.7 6.2 6.4 7.5 8.4  HGB 8.8* 7.9* 8.1* 8.2* 7.5*  HCT 28.7* 26.0* 27.7* 28.0* 26.4*  MCV 96.6 97.0 97.5 97.9 97.4  PLT 216 236 251 283 313    Cardiac Enzymes: No results for input(s): CKTOTAL, CKMB, CKMBINDEX, TROPONINI in the last 168 hours.  BNP (last 3 results) No results for input(s): BNP in the last 8760 hours.  ProBNP (last 3 results) No results for input(s): PROBNP in the last 8760 hours.  Radiological Exams: DG CHEST PORT 1 VIEW  Result Date: 03/27/2020 CLINICAL DATA:  Hypoxia. EXAM: PORTABLE CHEST 1 VIEW COMPARISON:  Mar 26, 2020 FINDINGS: There is stable tracheostomy tube positioning. Moderate severity diffuse bilateral coarse appearing airspace opacities are again seen. There is no evidence of a pleural effusion or pneumothorax. The heart size and mediastinal contours are within normal limits. There is moderate severity calcification of the thoracic aorta. The visualized skeletal structures are unremarkable. IMPRESSION: Stable moderate severity diffuse bilateral coarse-appearing airspace opacities. While this may represent chronic interstitial lung disease, superimposed acute infiltrate cannot be excluded. Electronically Signed   By: Aram Candela M.D.   On: 03/27/2020 15:21   DG CHEST PORT 1 VIEW  Result Date: 03/26/2020 CLINICAL DATA:  Congestive heart failure EXAM: PORTABLE CHEST 1 VIEW COMPARISON:  Mar 22, 2020 FINDINGS: The tracheostomy tube is stable in positioning. Diffuse bilateral coarse airspace opacities are noted. There is  no pneumothorax. No significant pleural effusion. The heart size is stable. Aortic calcifications are noted. IMPRESSION: Stable appearance of the chest with persistent diffuse bilateral coarse airspace opacities. Electronically Signed   By: Constance Holster M.D.   On: 03/26/2020 18:18   VAS [place] LOWER EXTREMITY VENOUS (DVT)  Result Date: 03/26/2020  Lower Venous DVTStudy Indications: Pulmonary embolism.  Risk  Factors: None identified. Limitations: Body habitus, poor ultrasound/tissue interface and patient positioning. Comparison Study: No prior studies. Performing Technologist: Oliver Hum RVT  Examination Guidelines: A complete evaluation includes B-mode imaging, spectral Doppler, color Doppler, and power Doppler as needed of all accessible portions of each vessel. Bilateral testing is considered an integral part of a complete examination. Limited examinations for reoccurring indications may be performed as noted. The reflux portion of the exam is performed with the patient in reverse Trendelenburg.  +---------+---------------+---------+-----------+----------+--------------+ RIGHT    CompressibilityPhasicitySpontaneityPropertiesThrombus Aging +---------+---------------+---------+-----------+----------+--------------+ CFV      Full           Yes      Yes                                 +---------+---------------+---------+-----------+----------+--------------+ SFJ      Full                                                        +---------+---------------+---------+-----------+----------+--------------+ FV Prox  Full                                                        +---------+---------------+---------+-----------+----------+--------------+ FV Mid   Full                                                        +---------+---------------+---------+-----------+----------+--------------+ FV DistalFull                                                        +---------+---------------+---------+-----------+----------+--------------+ PFV      Full                                                        +---------+---------------+---------+-----------+----------+--------------+ POP      Full           Yes      Yes                                 +---------+---------------+---------+-----------+----------+--------------+ PTV      Partial  Acute          +---------+---------------+---------+-----------+----------+--------------+ PERO     Partial                                      Acute          +---------+---------------+---------+-----------+----------+--------------+ Gastroc  Partial                                      Acute          +---------+---------------+---------+-----------+----------+--------------+   +---------+---------------+---------+-----------+----------+--------------+ LEFT     CompressibilityPhasicitySpontaneityPropertiesThrombus Aging +---------+---------------+---------+-----------+----------+--------------+ CFV      Partial        Yes      Yes                  Acute          +---------+---------------+---------+-----------+----------+--------------+ SFJ      Full                                                        +---------+---------------+---------+-----------+----------+--------------+ FV Prox  Full           Yes      Yes                                 +---------+---------------+---------+-----------+----------+--------------+ FV Mid   Full                                                        +---------+---------------+---------+-----------+----------+--------------+ FV DistalFull                                                        +---------+---------------+---------+-----------+----------+--------------+ PFV      Full                                                        +---------+---------------+---------+-----------+----------+--------------+ POP      Full           Yes      Yes                                 +---------+---------------+---------+-----------+----------+--------------+ PTV      Partial                                      Acute          +---------+---------------+---------+-----------+----------+--------------+ PERO     Full                                                         +---------+---------------+---------+-----------+----------+--------------+  EIV      Full           Yes      Yes                                 +---------+---------------+---------+-----------+----------+--------------+     Summary: RIGHT: - Findings consistent with acute deep vein thrombosis involving the right posterior tibial veins, right peroneal veins, and right gastrocnemius veins. - No cystic structure found in the popliteal fossa.  LEFT: - Findings consistent with acute deep vein thrombosis involving the left common femoral vein, and left posterior tibial veins. - No cystic structure found in the popliteal fossa.  *See table(s) above for measurements and observations. Electronically signed by Waverly Ferrari MD on 03/26/2020 at 7:20:37 PM.    Final     Assessment/Plan Active Problems:   Acute on chronic respiratory failure with hypoxia (HCC)   COVID-19 virus infection   Chronic atrial fibrillation with rapid ventricular response (HCC)   Chronic kidney disease, stage III (moderate)   Healthcare-associated pneumonia   1. Acute on chronic respiratory failure with hypoxia we will continue with pressure assist control currently on 45% FiO2 PEEP is 8 IP 12 we will continue to monitor the patient's volumes closely.  There has been some hemoptysis noted likely related to suctioning.  Appears to be improving spoke with the respiratory therapy about ice lavage saline 2. COVID-19 virus infection treated clinically is improved 3. Chronic atrial fibrillation rate is controlled at this time we will continue to monitor 4. Chronic kidney disease stage III we will continue to follow 5. Healthcare associated pneumonia treated we will continue present management   I have personally seen and evaluated the patient, evaluated laboratory and imaging results, formulated the assessment and plan and placed orders. The Patient requires high complexity decision making with multiple systems involvement.   Rounds were done with the Respiratory Therapy Director and Staff therapists and discussed with nursing staff also.  Yevonne Pax, MD [facility] Pulmonary Critical Care Medicine Sleep Medicine

## 2020-03-29 DIAGNOSIS — J9621 Acute and chronic respiratory failure with hypoxia: Secondary | ICD-10-CM | POA: Diagnosis not present

## 2020-03-29 DIAGNOSIS — I482 Chronic atrial fibrillation, unspecified: Secondary | ICD-10-CM | POA: Diagnosis not present

## 2020-03-29 DIAGNOSIS — U071 COVID-19: Secondary | ICD-10-CM | POA: Diagnosis not present

## 2020-03-29 DIAGNOSIS — N183 Chronic kidney disease, stage 3 unspecified: Secondary | ICD-10-CM | POA: Diagnosis not present

## 2020-03-29 NOTE — Progress Notes (Signed)
Pulmonary Critical Care Medicine [facility] [place]   PULMONARY CRITICAL CARE SERVICE  PROGRESS NOTE  Date of Service: 03/29/2020  Russell Rios  WUX:[id number]  DOB: 08-06-1946   DOA: 03/02/2020  Referring Physician: Carron Curie, MD  HPI:  Chura is a 74 y.o. male seen for follow up of Acute on Chronic Respiratory Failure.  Patient currently is on T collar completing 24 hours today right now is on 50% FiO2  Medications: Reviewed on Rounds  Physical Exam:  Vitals: Temperature is 97.9 pulse 60 respiratory 25 blood pressure is 125/64 saturations 100%  Ventilator Settings on T collar with an FiO2 of 50%  . General: Comfortable at this time . Eyes: Grossly normal lids, irises & conjunctiva . ENT: grossly tongue is normal . Neck: no obvious mass . Cardiovascular: S1 S2 normal no gallop . Respiratory: No rhonchi no rales are noted at this time . Abdomen: soft . Skin: no rash seen on limited exam . Musculoskeletal: not rigid . Psychiatric:unable to assess . Neurologic: no seizure no involuntary movements         Lab Data:   Basic Metabolic Panel: Recent Labs  Lab 03/23/20 0504 03/25/20 0626 03/27/20 1036  NA 140 140 138  K 3.5 3.7 3.9  CL 100 99 97*  CO2 29 33* 31  GLUCOSE 123* 129* 161*  BUN 38* 27* 36*  CREATININE 1.32* 1.20 1.42*  CALCIUM 9.0 9.2 9.4    ABG: Recent Labs  Lab 03/23/20 1405 03/27/20 1930  PHART 7.451* 7.426  PCO2ART 46.1 54.7*  PO2ART 44.9* 97.7  HCO3 31.7* 34.9*  O2SAT 81.1 98.2    Liver Function Tests: No results for input(s): AST, ALT, ALKPHOS, BILITOT, PROT, ALBUMIN in the last 168 hours. No results for input(s): LIPASE, AMYLASE in the last 168 hours. No results for input(s): AMMONIA in the last 168 hours.  CBC: Recent Labs  Lab 03/22/20 1700 03/23/20 0504 03/23/20 1522 03/24/20 0727 03/27/20 1036  WBC 6.7 6.2 6.4 7.5 8.4  HGB 8.8* 7.9* 8.1* 8.2* 7.5*  HCT 28.7* 26.0* 27.7* 28.0* 26.4*  MCV 96.6 97.0 97.5  97.9 97.4  PLT 216 236 251 283 313    Cardiac Enzymes: No results for input(s): CKTOTAL, CKMB, CKMBINDEX, TROPONINI in the last 168 hours.  BNP (last 3 results) No results for input(s): BNP in the last 8760 hours.  ProBNP (last 3 results) No results for input(s): PROBNP in the last 8760 hours.  Radiological Exams: DG CHEST PORT 1 VIEW  Result Date: 03/27/2020 CLINICAL DATA:  Hypoxia. EXAM: PORTABLE CHEST 1 VIEW COMPARISON:  Mar 26, 2020 FINDINGS: There is stable tracheostomy tube positioning. Moderate severity diffuse bilateral coarse appearing airspace opacities are again seen. There is no evidence of a pleural effusion or pneumothorax. The heart size and mediastinal contours are within normal limits. There is moderate severity calcification of the thoracic aorta. The visualized skeletal structures are unremarkable. IMPRESSION: Stable moderate severity diffuse bilateral coarse-appearing airspace opacities. While this may represent chronic interstitial lung disease, superimposed acute infiltrate cannot be excluded. Electronically Signed   By: Aram Candela M.D.   On: 03/27/2020 15:21    Assessment/Plan Active Problems:   Acute on chronic respiratory failure with hypoxia (HCC)   COVID-19 virus infection   Chronic atrial fibrillation with rapid ventricular response (HCC)   Chronic kidney disease, stage III (moderate)   Healthcare-associated pneumonia   1. Acute on chronic respiratory failure hypoxia continue T collar trials patient currently is on 50% FiO2 with a goal  of 24 hours. 2. COVID-19 virus infection resolved we will continue to follow along 3. Chronic atrial fibrillation rate is controlled we will continue with supportive care 4. Healthcare associated pneumonia treated continue to monitor 5. Chronic kidney disease stage III following labs closely   I have personally seen and evaluated the patient, evaluated laboratory and imaging results, formulated the assessment and plan  and placed orders. The Patient requires high complexity decision making with multiple systems involvement.  Rounds were done with the Respiratory Therapy Director and Staff therapists and discussed with nursing staff also.  Allyne Gee, MD [facility] Pulmonary Critical Care Medicine Sleep Medicine

## 2020-03-30 DIAGNOSIS — I482 Chronic atrial fibrillation, unspecified: Secondary | ICD-10-CM | POA: Diagnosis not present

## 2020-03-30 DIAGNOSIS — U071 COVID-19: Secondary | ICD-10-CM | POA: Diagnosis not present

## 2020-03-30 DIAGNOSIS — N183 Chronic kidney disease, stage 3 unspecified: Secondary | ICD-10-CM | POA: Diagnosis not present

## 2020-03-30 DIAGNOSIS — J9621 Acute and chronic respiratory failure with hypoxia: Secondary | ICD-10-CM | POA: Diagnosis not present

## 2020-03-30 LAB — BASIC METABOLIC PANEL
Anion gap: 10 (ref 5–15)
BUN: 44 mg/dL — ABNORMAL HIGH (ref 8–23)
CO2: 33 mmol/L — ABNORMAL HIGH (ref 22–32)
Calcium: 9.3 mg/dL (ref 8.9–10.3)
Chloride: 96 mmol/L — ABNORMAL LOW (ref 98–111)
Creatinine, Ser: 1.44 mg/dL — ABNORMAL HIGH (ref 0.61–1.24)
GFR calc Af Amer: 55 mL/min — ABNORMAL LOW (ref >60)
GFR calc non Af Amer: 47 mL/min — ABNORMAL LOW (ref >60)
Glucose, Bld: 119 mg/dL — ABNORMAL HIGH (ref 70–99)
Potassium: 3.8 mmol/L (ref 3.5–5.1)
Sodium: 139 mmol/L (ref 135–145)

## 2020-03-30 LAB — CBC
HCT: 26.2 % — ABNORMAL LOW (ref 39.0–52.0)
Hemoglobin: 7.5 g/dL — ABNORMAL LOW (ref 13.0–17.0)
MCH: 27.4 pg (ref 26.0–34.0)
MCHC: 28.6 g/dL — ABNORMAL LOW (ref 30.0–36.0)
MCV: 95.6 fL (ref 80.0–100.0)
Platelets: 394 10*3/uL (ref 150–400)
RBC: 2.74 MIL/uL — ABNORMAL LOW (ref 4.22–5.81)
RDW: 17.8 % — ABNORMAL HIGH (ref 11.5–15.5)
WBC: 10.9 10*3/uL — ABNORMAL HIGH (ref 4.0–10.5)
nRBC: 0.2 % (ref 0.0–0.2)

## 2020-03-30 NOTE — Progress Notes (Signed)
Pulmonary Critical Care Medicine [facility] [place]   PULMONARY CRITICAL CARE SERVICE  PROGRESS NOTE  Date of Service: 03/30/2020  Russell Rios  ZOX:[id number]  DOB: [birth year]   DOA: 03/02/2020  Referring Physician: Carron Curie, MD  HPI: Russell Rios is a 74 y.o. male seen for follow up of Acute on Chronic Respiratory Failure.  Patient currently is on T collar has been on 50% FiO2 completed 24 hours.  Patient doing a little bit better than he has been for the last couple of days.  Saturations are also improved so we should be able to possibly decrease the FiO2 down  Medications: Reviewed on Rounds  Physical Exam:  Vitals: Temperature 97.0 pulse 89 respiratory rate 28 blood pressure is 105/59 saturations 95%  Ventilator Settings on T collar with an FiO2 of 50%  . General: Comfortable at this time . Eyes: Grossly normal lids, irises & conjunctiva . ENT: grossly tongue is normal . Neck: no obvious mass . Cardiovascular: S1 S2 normal no gallop . Respiratory: No rhonchi no rales are noted at this time . Abdomen: soft . Skin: no rash seen on limited exam . Musculoskeletal: not rigid . Psychiatric:unable to assess . Neurologic: no seizure no involuntary movements         Lab Data:   Basic Metabolic Panel: Recent Labs  Lab 03/25/20 0626 03/27/20 1036 03/30/20 0544  NA 140 138 139  K 3.7 3.9 3.8  CL 99 97* 96*  CO2 33* 31 33*  GLUCOSE 129* 161* 119*  BUN 27* 36* 44*  CREATININE 1.20 1.42* 1.44*  CALCIUM 9.2 9.4 9.3    ABG: Recent Labs  Lab 03/23/20 1405 03/27/20 1930  PHART 7.451* 7.426  PCO2ART 46.1 54.7*  PO2ART 44.9* 97.7  HCO3 31.7* 34.9*  O2SAT 81.1 98.2    Liver Function Tests: No results for input(s): AST, ALT, ALKPHOS, BILITOT, PROT, ALBUMIN in the last 168 hours. No results for input(s): LIPASE, AMYLASE in the last 168 hours. No results for input(s): AMMONIA in the last 168 hours.  CBC: Recent Labs  Lab 03/23/20 1522 03/24/20 0727  03/27/20 1036 03/30/20 0544  WBC 6.4 7.5 8.4 10.9*  HGB 8.1* 8.2* 7.5* 7.5*  HCT 27.7* 28.0* 26.4* 26.2*  MCV 97.5 97.9 97.4 95.6  PLT 251 283 313 394    Cardiac Enzymes: No results for input(s): CKTOTAL, CKMB, CKMBINDEX, TROPONINI in the last 168 hours.  BNP (last 3 results) No results for input(s): BNP in the last 8760 hours.  ProBNP (last 3 results) No results for input(s): PROBNP in the last 8760 hours.  Radiological Exams: No results found.  Assessment/Plan Active Problems:   Acute on chronic respiratory failure with hypoxia (HCC)   COVID-19 virus infection   Chronic atrial fibrillation with rapid ventricular response (HCC)   Chronic kidney disease, stage III (moderate)   Healthcare-associated pneumonia   1. Acute on chronic respiratory failure with hypoxia patient is doing better on the T-bar right now requiring 50% FiO2.  Last ABG results as already noted above were improving. 2. COVID-19 virus infection in resolution phase we will continue to monitor 3. Chronic atrial fibrillation rate is controlled we will continue with supportive care. 4. Chronic kidney disease stage III at baseline we will continue to follow 5. Healthcare associated pneumonia treated improving slowly though   I have personally seen and evaluated the patient, evaluated laboratory and imaging results, formulated the assessment and plan and placed orders. The Patient requires high complexity decision making with multiple  systems involvement.  Rounds were done with the Respiratory Therapy Director and Staff therapists and discussed with nursing staff also.  Allyne Gee, MD [facility] Pulmonary Critical Care Medicine Sleep Medicine

## 2020-03-31 DIAGNOSIS — I482 Chronic atrial fibrillation, unspecified: Secondary | ICD-10-CM | POA: Diagnosis not present

## 2020-03-31 DIAGNOSIS — J9621 Acute and chronic respiratory failure with hypoxia: Secondary | ICD-10-CM | POA: Diagnosis not present

## 2020-03-31 DIAGNOSIS — U071 COVID-19: Secondary | ICD-10-CM | POA: Diagnosis not present

## 2020-03-31 DIAGNOSIS — N183 Chronic kidney disease, stage 3 unspecified: Secondary | ICD-10-CM | POA: Diagnosis not present

## 2020-03-31 NOTE — Progress Notes (Signed)
Pulmonary Critical Care Medicine [facility] [place]   PULMONARY CRITICAL CARE SERVICE  PROGRESS NOTE  Date of Service: 03/31/2020  Russell Rios  ZOX:[id number]  DOB: 1946/10/22   DOA: 03/02/2020  Referring Physician: Carron Curie, MD  HPI: Russell Rios is a 74 y.o. male seen for follow up of Acute on Chronic Respiratory Failure.  Patient currently is on T collar has been on 48-hour goal and requiring 40% FiO2  Medications: Reviewed on Rounds  Physical Exam:  Vitals: Temperature is 97.6 pulse 83 respiratory 28 blood pressure is 105/47 saturations 99%  Ventilator Settings on T collar with an FiO2 of 40%  . General: Comfortable at this time . Eyes: Grossly normal lids, irises & conjunctiva . ENT: grossly tongue is normal . Neck: no obvious mass . Cardiovascular: S1 S2 normal no gallop . Respiratory: No rhonchi no rales are noted at this time . Abdomen: soft . Skin: no rash seen on limited exam . Musculoskeletal: not rigid . Psychiatric:unable to assess . Neurologic: no seizure no involuntary movements         Lab Data:   Basic Metabolic Panel: Recent Labs  Lab 03/25/20 0626 03/27/20 1036 03/30/20 0544  NA 140 138 139  K 3.7 3.9 3.8  CL 99 97* 96*  CO2 33* 31 33*  GLUCOSE 129* 161* 119*  BUN 27* 36* 44*  CREATININE 1.20 1.42* 1.44*  CALCIUM 9.2 9.4 9.3    ABG: Recent Labs  Lab 03/27/20 1930  PHART 7.426  PCO2ART 54.7*  PO2ART 97.7  HCO3 34.9*  O2SAT 98.2    Liver Function Tests: No results for input(s): AST, ALT, ALKPHOS, BILITOT, PROT, ALBUMIN in the last 168 hours. No results for input(s): LIPASE, AMYLASE in the last 168 hours. No results for input(s): AMMONIA in the last 168 hours.  CBC: Recent Labs  Lab 03/27/20 1036 03/30/20 0544  WBC 8.4 10.9*  HGB 7.5* 7.5*  HCT 26.4* 26.2*  MCV 97.4 95.6  PLT 313 394    Cardiac Enzymes: No results for input(s): CKTOTAL, CKMB, CKMBINDEX, TROPONINI in the last 168 hours.  BNP (last 3  results) No results for input(s): BNP in the last 8760 hours.  ProBNP (last 3 results) No results for input(s): PROBNP in the last 8760 hours.  Radiological Exams: No results found.  Assessment/Plan Active Problems:   Acute on chronic respiratory failure with hypoxia (HCC)   COVID-19 virus infection   Chronic atrial fibrillation with rapid ventricular response (HCC)   Chronic kidney disease, stage III (moderate)   Healthcare-associated pneumonia   1. Acute on chronic respiratory failure with hypoxia we will continue with the wean on the T collar patient for past 48 hours today 2. COVID-19 virus infection treated resolving 3. Chronic atrial fibrillation rate controlled 4. Chronic kidney disease stage III continue to follow the patient's labs 5. Healthcare associated pneumonia completed antibiotics followed by infectious disease   I have personally seen and evaluated the patient, evaluated laboratory and imaging results, formulated the assessment and plan and placed orders. The Patient requires high complexity decision making with multiple systems involvement.  Rounds were done with the Respiratory Therapy Director and Staff therapists and discussed with nursing staff also.  Yevonne Pax, MD [facility] Pulmonary Critical Care Medicine Sleep Medicine

## 2020-04-01 ENCOUNTER — Other Ambulatory Visit (HOSPITAL_COMMUNITY): Payer: Medicare Other

## 2020-04-01 DIAGNOSIS — J9621 Acute and chronic respiratory failure with hypoxia: Secondary | ICD-10-CM | POA: Diagnosis not present

## 2020-04-01 DIAGNOSIS — I482 Chronic atrial fibrillation, unspecified: Secondary | ICD-10-CM | POA: Diagnosis not present

## 2020-04-01 DIAGNOSIS — N183 Chronic kidney disease, stage 3 unspecified: Secondary | ICD-10-CM | POA: Diagnosis not present

## 2020-04-01 DIAGNOSIS — U071 COVID-19: Secondary | ICD-10-CM | POA: Diagnosis not present

## 2020-04-01 NOTE — Progress Notes (Signed)
Pulmonary Critical Care Medicine [facility] [place]   PULMONARY CRITICAL CARE SERVICE  PROGRESS NOTE  Date of Service: 04/01/2020  Russell Rios  OEU:[id number]  DOB: May 13, 1946   DOA: 03/02/2020  Referring Physician: Carron Curie, MD  HPI: Russell Rios is a 74 y.o. male seen for follow up of Acute on Chronic Respiratory Failure.  Patient remains on T collar currently is on 60% FiO2.  Secretions are still quite copious though not blood-tinged  Medications: Reviewed on Rounds  Physical Exam:  Vitals: Temperature is 99.1 pulse 86 respiratory rate 28 blood pressure is 91/47 saturations 96%  Ventilator Settings on T collar FiO2 60%  . General: Comfortable at this time . Eyes: Grossly normal lids, irises & conjunctiva . ENT: grossly tongue is normal . Neck: no obvious mass . Cardiovascular: S1 S2 normal no gallop . Respiratory: No rhonchi no rales are noted at this time . Abdomen: soft . Skin: no rash seen on limited exam . Musculoskeletal: not rigid . Psychiatric:unable to assess . Neurologic: no seizure no involuntary movements         Lab Data:   Basic Metabolic Panel: Recent Labs  Lab 03/27/20 1036 03/30/20 0544  NA 138 139  K 3.9 3.8  CL 97* 96*  CO2 31 33*  GLUCOSE 161* 119*  BUN 36* 44*  CREATININE 1.42* 1.44*  CALCIUM 9.4 9.3    ABG: Recent Labs  Lab 03/27/20 1930  PHART 7.426  PCO2ART 54.7*  PO2ART 97.7  HCO3 34.9*  O2SAT 98.2    Liver Function Tests: No results for input(s): AST, ALT, ALKPHOS, BILITOT, PROT, ALBUMIN in the last 168 hours. No results for input(s): LIPASE, AMYLASE in the last 168 hours. No results for input(s): AMMONIA in the last 168 hours.  CBC: Recent Labs  Lab 03/27/20 1036 03/30/20 0544  WBC 8.4 10.9*  HGB 7.5* 7.5*  HCT 26.4* 26.2*  MCV 97.4 95.6  PLT 313 394    Cardiac Enzymes: No results for input(s): CKTOTAL, CKMB, CKMBINDEX, TROPONINI in the last 168 hours.  BNP (last 3 results) No results for  input(s): BNP in the last 8760 hours.  ProBNP (last 3 results) No results for input(s): PROBNP in the last 8760 hours.  Radiological Exams: No results found.  Assessment/Plan Active Problems:   Acute on chronic respiratory failure with hypoxia (HCC)   COVID-19 virus infection   Chronic atrial fibrillation with rapid ventricular response (HCC)   Chronic kidney disease, stage III (moderate)   Healthcare-associated pneumonia   1. Acute on chronic respiratory failure hypoxia we will continue with T collar trials currently on 60% FiO2.  Saturations were good so we will try to wean the FiO2 down if possible 2. COVID-19 virus infection treated resolved 3. Chronic atrial fibrillation rate controlled 4. Chronic kidney disease stage III we will continue to follow 5. Healthcare associated pneumonia treated we will continue present management   I have personally seen and evaluated the patient, evaluated laboratory and imaging results, formulated the assessment and plan and placed orders. The Patient requires high complexity decision making with multiple systems involvement.  Rounds were done with the Respiratory Therapy Director and Staff therapists and discussed with nursing staff also.  Yevonne Pax, MD [facility] Pulmonary Critical Care Medicine Sleep Medicine

## 2020-04-02 DIAGNOSIS — J9621 Acute and chronic respiratory failure with hypoxia: Secondary | ICD-10-CM | POA: Diagnosis not present

## 2020-04-02 DIAGNOSIS — U071 COVID-19: Secondary | ICD-10-CM | POA: Diagnosis not present

## 2020-04-02 DIAGNOSIS — I482 Chronic atrial fibrillation, unspecified: Secondary | ICD-10-CM | POA: Diagnosis not present

## 2020-04-02 DIAGNOSIS — N183 Chronic kidney disease, stage 3 unspecified: Secondary | ICD-10-CM | POA: Diagnosis not present

## 2020-04-02 LAB — BASIC METABOLIC PANEL
Anion gap: 11 (ref 5–15)
BUN: 62 mg/dL — ABNORMAL HIGH (ref 8–23)
CO2: 32 mmol/L (ref 22–32)
Calcium: 9.2 mg/dL (ref 8.9–10.3)
Chloride: 93 mmol/L — ABNORMAL LOW (ref 98–111)
Creatinine, Ser: 2.4 mg/dL — ABNORMAL HIGH (ref 0.61–1.24)
GFR calc Af Amer: 30 mL/min — ABNORMAL LOW (ref >60)
GFR calc non Af Amer: 26 mL/min — ABNORMAL LOW (ref >60)
Glucose, Bld: 127 mg/dL — ABNORMAL HIGH (ref 70–99)
Potassium: 3.7 mmol/L (ref 3.5–5.1)
Sodium: 136 mmol/L (ref 135–145)

## 2020-04-02 LAB — CBC
HCT: 25.7 % — ABNORMAL LOW (ref 39.0–52.0)
Hemoglobin: 7.6 g/dL — ABNORMAL LOW (ref 13.0–17.0)
MCH: 28 pg (ref 26.0–34.0)
MCHC: 29.6 g/dL — ABNORMAL LOW (ref 30.0–36.0)
MCV: 94.8 fL (ref 80.0–100.0)
Platelets: 469 10*3/uL — ABNORMAL HIGH (ref 150–400)
RBC: 2.71 MIL/uL — ABNORMAL LOW (ref 4.22–5.81)
RDW: 18 % — ABNORMAL HIGH (ref 11.5–15.5)
WBC: 11.1 10*3/uL — ABNORMAL HIGH (ref 4.0–10.5)
nRBC: 0.2 % (ref 0.0–0.2)

## 2020-04-02 LAB — ALBUMIN: Albumin: 1.8 g/dL — ABNORMAL LOW (ref 3.5–5.0)

## 2020-04-02 LAB — MISC LABCORP TEST (SEND OUT): Labcorp test code: 88005

## 2020-04-02 NOTE — Progress Notes (Addendum)
Pulmonary Critical Care Medicine [facility] [place]   PULMONARY CRITICAL CARE SERVICE  PROGRESS NOTE  Date of Service: 04/02/2020  Duane Earnshaw  WVP:[id number]  DOB: [birth year]   DOA: 03/02/2020  Referring Physician: Carron Curie, MD  HPI: Bently Wyss is a 74 y.o. male seen for follow up of Acute on Chronic Respiratory Failure. Patient remains on 50% ATC, satting well, no acute distress  Medications: Reviewed on Rounds  Physical Exam:  Vitals: pulse 80, resp 23, bp 114/55, sat 100, temp 97.9  Ventilator Settings 50% ATC  . General: Comfortable at this time . Eyes: Grossly normal lids, irises & conjunctiva . ENT: grossly tongue is normal . Neck: no obvious mass . Cardiovascular: S1 S2 normal no gallop . Respiratory: no rales or ronchi . Abdomen: soft . Skin: no rash seen on limited exam . Musculoskeletal: not rigid . Psychiatric:unable to assess . Neurologic: no seizure no involuntary movements         Lab Data:   Basic Metabolic Panel: Recent Labs  Lab 03/27/20 1036 03/30/20 0544 04/02/20 0717  NA 138 139 136  K 3.9 3.8 3.7  CL 97* 96* 93*  CO2 31 33* 32  GLUCOSE 161* 119* 127*  BUN 36* 44* 62*  CREATININE 1.42* 1.44* 2.40*  CALCIUM 9.4 9.3 9.2    ABG: Recent Labs  Lab 03/27/20 1930  PHART 7.426  PCO2ART 54.7*  PO2ART 97.7  HCO3 34.9*  O2SAT 98.2    Liver Function Tests: Recent Labs  Lab 04/02/20 0717  ALBUMIN 1.8*   No results for input(s): LIPASE, AMYLASE in the last 168 hours. No results for input(s): AMMONIA in the last 168 hours.  CBC: Recent Labs  Lab 03/27/20 1036 03/30/20 0544 04/02/20 0717  WBC 8.4 10.9* 11.1*  HGB 7.5* 7.5* 7.6*  HCT 26.4* 26.2* 25.7*  MCV 97.4 95.6 94.8  PLT 313 394 469*    Cardiac Enzymes: No results for input(s): CKTOTAL, CKMB, CKMBINDEX, TROPONINI in the last 168 hours.  BNP (last 3 results) No results for input(s): BNP in the last 8760 hours.  ProBNP (last 3 results) No results for  input(s): PROBNP in the last 8760 hours.  Radiological Exams: DG CHEST PORT 1 VIEW  Result Date: 04/01/2020 CLINICAL DATA:  Follow-up infiltrates EXAM: PORTABLE CHEST 1 VIEW COMPARISON:  03/27/2020 FINDINGS: Cardiac shadow is stable. Tracheostomy tube is again seen and stable. The lungs are well aerated bilaterally. Patchy airspace opacities are again identified and stable from the prior exam. No new focal infiltrate or effusion is seen. No bony abnormality is noted. IMPRESSION: Stable bilateral airspace opacities most likely representing an overall chronic fibrotic pattern. Stable appearance is noted when compared with multiple previous exams. No acute abnormality is noted. Electronically Signed   By: Alcide Clever M.D.   On: 04/01/2020 16:01    Assessment/Plan Active Problems:   Acute on chronic respiratory failure with hypoxia (HCC)   COVID-19 virus infection   Chronic atrial fibrillation with rapid ventricular response (HCC)   Chronic kidney disease, stage III (moderate)   Healthcare-associated pneumonia   1. Acute on chronic respiratory failure hypoxia we will continue with T collar trials currently on 50% FiO2.  Saturations were good so we will try to wean the FiO2 down if possible 2. COVID-19 virus infection treated resolved 3. Chronic atrial fibrillation rate controlled 4. Chronic kidney disease stage III we will continue to follow 5. Healthcare associated pneumonia treated we will continue present management   I have personally seen  and evaluated the patient, evaluated laboratory and imaging results, formulated the assessment and plan and placed orders. The Patient requires high complexity decision making with multiple systems involvement.  Rounds were done with the Respiratory Therapy Director and Staff therapists and discussed with nursing staff also.  Allyne Gee, MD [facility] Pulmonary Critical Care Medicine Sleep Medicine

## 2020-04-03 DIAGNOSIS — I482 Chronic atrial fibrillation, unspecified: Secondary | ICD-10-CM | POA: Diagnosis not present

## 2020-04-03 DIAGNOSIS — J9621 Acute and chronic respiratory failure with hypoxia: Secondary | ICD-10-CM | POA: Diagnosis not present

## 2020-04-03 DIAGNOSIS — U071 COVID-19: Secondary | ICD-10-CM | POA: Diagnosis not present

## 2020-04-03 DIAGNOSIS — N183 Chronic kidney disease, stage 3 unspecified: Secondary | ICD-10-CM | POA: Diagnosis not present

## 2020-04-03 LAB — CBC
HCT: 22.7 % — ABNORMAL LOW (ref 39.0–52.0)
HCT: 29.7 % — ABNORMAL LOW (ref 39.0–52.0)
Hemoglobin: 6.9 g/dL — CL (ref 13.0–17.0)
Hemoglobin: 8.9 g/dL — ABNORMAL LOW (ref 13.0–17.0)
MCH: 28.8 pg (ref 26.0–34.0)
MCH: 27.4 pg (ref 26.0–34.0)
MCHC: 30.4 g/dL (ref 30.0–36.0)
MCHC: 30 g/dL (ref 30.0–36.0)
MCV: 94.6 fL (ref 80.0–100.0)
MCV: 91.4 fL (ref 80.0–100.0)
Platelets: 451 10*3/uL — ABNORMAL HIGH (ref 150–400)
Platelets: 378 10*3/uL (ref 150–400)
RBC: 2.4 MIL/uL — ABNORMAL LOW (ref 4.22–5.81)
RBC: 3.25 MIL/uL — ABNORMAL LOW (ref 4.22–5.81)
RDW: 17.8 % — ABNORMAL HIGH (ref 11.5–15.5)
RDW: 18.8 % — ABNORMAL HIGH (ref 11.5–15.5)
WBC: 10.3 10*3/uL (ref 4.0–10.5)
WBC: 10.8 10*3/uL — ABNORMAL HIGH (ref 4.0–10.5)
nRBC: 0 % (ref 0.0–0.2)
nRBC: 0 % (ref 0.0–0.2)

## 2020-04-03 LAB — BASIC METABOLIC PANEL
Anion gap: 10 (ref 5–15)
BUN: 69 mg/dL — ABNORMAL HIGH (ref 8–23)
CO2: 34 mmol/L — ABNORMAL HIGH (ref 22–32)
Calcium: 9.1 mg/dL (ref 8.9–10.3)
Chloride: 93 mmol/L — ABNORMAL LOW (ref 98–111)
Creatinine, Ser: 2.6 mg/dL — ABNORMAL HIGH (ref 0.61–1.24)
GFR calc Af Amer: 27 mL/min — ABNORMAL LOW (ref >60)
GFR calc non Af Amer: 23 mL/min — ABNORMAL LOW (ref >60)
Glucose, Bld: 118 mg/dL — ABNORMAL HIGH (ref 70–99)
Potassium: 3.5 mmol/L (ref 3.5–5.1)
Sodium: 137 mmol/L (ref 135–145)

## 2020-04-03 LAB — PREPARE RBC (CROSSMATCH)

## 2020-04-03 NOTE — Progress Notes (Addendum)
Pulmonary Critical Care Medicine [facility] [place]   PULMONARY CRITICAL CARE SERVICE  PROGRESS NOTE  Date of Service: 04/03/2020  Russell Rios  RJJ:[id number]  DOB: 09-Jan-1946   DOA: 03/02/2020  Referring Physician: Carron Curie, MD  HPI: Russell Rios is a 74 y.o. male seen for follow up of Acute on Chronic Respiratory Failure.  Patient mains on 40% aerosol trach collar satting well distress.  Medications: Reviewed on Rounds  Physical Exam:  Vitals: Pulse 07 respirations 23 BP 100/58 O2 sat 100% temp 92  Ventilator Settings 40% ATC  . General: Comfortable at this time . Eyes: Grossly normal lids, irises & conjunctiva . ENT: grossly tongue is normal . Neck: no obvious mass . Cardiovascular: S1 S2 normal no gallop . Respiratory: No rales or rhonchi noted . Abdomen: soft . Skin: no rash seen on limited exam . Musculoskeletal: not rigid . Psychiatric:unable to assess . Neurologic: no seizure no involuntary movements         Lab Data:   Basic Metabolic Panel: Recent Labs  Lab 03/30/20 0544 04/02/20 0717 04/03/20 0246  NA 139 136 137  K 3.8 3.7 3.5  CL 96* 93* 93*  CO2 33* 32 34*  GLUCOSE 119* 127* 118*  BUN 44* 62* 69*  CREATININE 1.44* 2.40* 2.60*  CALCIUM 9.3 9.2 9.1    ABG: Recent Labs  Lab 03/27/20 1930  PHART 7.426  PCO2ART 54.7*  PO2ART 97.7  HCO3 34.9*  O2SAT 98.2    Liver Function Tests: Recent Labs  Lab 04/02/20 0717  ALBUMIN 1.8*   No results for input(s): LIPASE, AMYLASE in the last 168 hours. No results for input(s): AMMONIA in the last 168 hours.  CBC: Recent Labs  Lab 03/30/20 0544 04/02/20 0717 04/03/20 0246  WBC 10.9* 11.1* 10.3  HGB 7.5* 7.6* 6.9*  HCT 26.2* 25.7* 22.7*  MCV 95.6 94.8 94.6  PLT 394 469* 451*    Cardiac Enzymes: No results for input(s): CKTOTAL, CKMB, CKMBINDEX, TROPONINI in the last 168 hours.  BNP (last 3 results) No results for input(s): BNP in the last 8760 hours.  ProBNP (last 3  results) No results for input(s): PROBNP in the last 8760 hours.  Radiological Exams: No results found.  Assessment/Plan Active Problems:   Acute on chronic respiratory failure with hypoxia (HCC)   COVID-19 virus infection   Chronic atrial fibrillation with rapid ventricular response (HCC)   Chronic kidney disease, stage III (moderate)   Healthcare-associated pneumonia   1. Acute on chronic respiratory failure hypoxia we will continue with T collar trials currently on 40% FiO2. Saturations were good so we will try to wean the FiO2 down if possible 2. COVID-19 virus infection treated resolved 3. Chronic atrial fibrillation rate controlled 4. Chronic kidney disease stage III we will continue to follow 5. Healthcare associated pneumonia treated we will continue present management   I have personally seen and evaluated the patient, evaluated laboratory and imaging results, formulated the assessment and plan and placed orders. The Patient requires high complexity decision making with multiple systems involvement.  Rounds were done with the Respiratory Therapy Director and Staff therapists and discussed with nursing staff also.  Yevonne Pax, MD [facility] Pulmonary Critical Care Medicine Sleep Medicine

## 2020-04-04 ENCOUNTER — Other Ambulatory Visit (HOSPITAL_COMMUNITY): Payer: Medicare Other

## 2020-04-04 DIAGNOSIS — U071 COVID-19: Secondary | ICD-10-CM | POA: Diagnosis not present

## 2020-04-04 DIAGNOSIS — N183 Chronic kidney disease, stage 3 unspecified: Secondary | ICD-10-CM | POA: Diagnosis not present

## 2020-04-04 DIAGNOSIS — J9621 Acute and chronic respiratory failure with hypoxia: Secondary | ICD-10-CM | POA: Diagnosis not present

## 2020-04-04 DIAGNOSIS — I482 Chronic atrial fibrillation, unspecified: Secondary | ICD-10-CM | POA: Diagnosis not present

## 2020-04-04 LAB — CBC
HCT: 27 % — ABNORMAL LOW (ref 39.0–52.0)
Hemoglobin: 8.4 g/dL — ABNORMAL LOW (ref 13.0–17.0)
MCH: 28.4 pg (ref 26.0–34.0)
MCHC: 31.1 g/dL (ref 30.0–36.0)
MCV: 91.2 fL (ref 80.0–100.0)
Platelets: 460 10*3/uL — ABNORMAL HIGH (ref 150–400)
RBC: 2.96 MIL/uL — ABNORMAL LOW (ref 4.22–5.81)
RDW: 18.8 % — ABNORMAL HIGH (ref 11.5–15.5)
WBC: 9.9 10*3/uL (ref 4.0–10.5)
nRBC: 0 % (ref 0.0–0.2)

## 2020-04-04 LAB — BPAM RBC
Blood Product Expiration Date: 202106172359
ISSUE DATE / TIME: 202105151249
Unit Type and Rh: 5100

## 2020-04-04 LAB — TYPE AND SCREEN
ABO/RH(D): O POS
Antibody Screen: NEGATIVE
Unit division: 0

## 2020-04-04 LAB — BASIC METABOLIC PANEL
Anion gap: 12 (ref 5–15)
BUN: 72 mg/dL — ABNORMAL HIGH (ref 8–23)
CO2: 34 mmol/L — ABNORMAL HIGH (ref 22–32)
Calcium: 9.2 mg/dL (ref 8.9–10.3)
Chloride: 90 mmol/L — ABNORMAL LOW (ref 98–111)
Creatinine, Ser: 2.9 mg/dL — ABNORMAL HIGH (ref 0.61–1.24)
GFR calc Af Amer: 24 mL/min — ABNORMAL LOW (ref >60)
GFR calc non Af Amer: 20 mL/min — ABNORMAL LOW (ref >60)
Glucose, Bld: 130 mg/dL — ABNORMAL HIGH (ref 70–99)
Potassium: 3.4 mmol/L — ABNORMAL LOW (ref 3.5–5.1)
Sodium: 136 mmol/L (ref 135–145)

## 2020-04-04 NOTE — Progress Notes (Addendum)
Pulmonary Critical Care Medicine [place]   PULMONARY CRITICAL CARE SERVICE  PROGRESS NOTE  Date of Service: 04/04/2020  Ajdin Macke  OZD:[id number]  DOB: 1946/01/31   DOA: 03/02/2020  Referring Physician: Merton Border, MD  HPI: Russell Rios is a 74 y.o. male seen for follow up of Acute on Chronic Respiratory Failure.  Patient continues to have copious amount of secretions on 50% aerosol trach collar satting well at this time.  Medications: Reviewed on Rounds  Physical Exam:  Vitals: Pulse 76 respirations 20 BP 112/53 O2 sat 100% temp 97.1  Ventilator Settings ATC 50%  . General: Comfortable at this time . Eyes: Grossly normal lids, irises & conjunctiva . ENT: grossly tongue is normal . Neck: no obvious mass . Cardiovascular: S1 S2 normal no gallop . Respiratory: No rales or rhonchi noted . Abdomen: soft . Skin: no rash seen on limited exam . Musculoskeletal: not rigid . Psychiatric:unable to assess . Neurologic: no seizure no involuntary movements         Lab Data:   Basic Metabolic Panel: Recent Labs  Lab 03/30/20 0544 04/02/20 0717 04/03/20 0246 04/04/20 0444  NA 139 136 137 136  K 3.8 3.7 3.5 3.4*  CL 96* 93* 93* 90*  CO2 33* 32 34* 34*  GLUCOSE 119* 127* 118* 130*  BUN 44* 62* 69* 72*  CREATININE 1.44* 2.40* 2.60* 2.90*  CALCIUM 9.3 9.2 9.1 9.2    ABG: No results for input(s): PHART, PCO2ART, PO2ART, HCO3, O2SAT in the last 168 hours.  Liver Function Tests: Recent Labs  Lab 04/02/20 0717  ALBUMIN 1.8*   No results for input(s): LIPASE, AMYLASE in the last 168 hours. No results for input(s): AMMONIA in the last 168 hours.  CBC: Recent Labs  Lab 03/30/20 0544 04/02/20 0717 04/03/20 0246 04/03/20 1747 04/04/20 0444  WBC 10.9* 11.1* 10.3 10.8* 9.9  HGB 7.5* 7.6* 6.9* 8.9* 8.4*  HCT 26.2* 25.7* 22.7* 29.7* 27.0*  MCV 95.6 94.8 94.6 91.4 91.2  PLT 394 469* 451* 378 460*    Cardiac Enzymes: No results for input(s):  CKTOTAL, CKMB, CKMBINDEX, TROPONINI in the last 168 hours.  BNP (last 3 results) No results for input(s): BNP in the last 8760 hours.  ProBNP (last 3 results) No results for input(s): PROBNP in the last 8760 hours.  Radiological Exams: DG CHEST PORT 1 VIEW  Result Date: 04/04/2020 CLINICAL DATA:  Pulmonary edema EXAM: PORTABLE CHEST 1 VIEW COMPARISON:  04/01/2020 FINDINGS: Stable positioning of tracheostomy tube. Stable cardiac silhouette. Persistent coarsened interstitial opacities throughout the mid to lower lung fields bilaterally compatible with chronic fibrotic lung changes. No new superimposed airspace opacity is evident. No large pleural fluid collection. No pneumothorax. IMPRESSION: Chronic fibrotic lung disease without evidence of new superimposed airspace opacity identified. Electronically Signed   By: Davina Poke D.O.   On: 04/04/2020 15:11    Assessment/Plan Active Problems:   Acute on chronic respiratory failure with hypoxia (HCC)   COVID-19 virus infection   Chronic atrial fibrillation with rapid ventricular response (HCC)   Chronic kidney disease, stage III (moderate)   Healthcare-associated pneumonia   1. Acute on chronic respiratory failure hypoxia we will continue with T collar trials currently on 50% FiO2. Saturations were good so we will try to wean the FiO2 down if possible 2. COVID-19 virus infection treated resolved 3. Chronic atrial fibrillation rate controlled 4. Chronic kidney disease stage III we will continue to follow 5. Healthcare associated pneumonia treated we will continue  present management   I have personally seen and evaluated the patient, evaluated laboratory and imaging results, formulated the assessment and plan and placed orders. The Patient requires high complexity decision making with multiple systems involvement.  Rounds were done with the Respiratory Therapy Director and Staff therapists and discussed with nursing staff also.  Yevonne Pax, MD [facility] Pulmonary Critical Care Medicine Sleep Medicine

## 2020-04-05 DIAGNOSIS — J9621 Acute and chronic respiratory failure with hypoxia: Secondary | ICD-10-CM | POA: Diagnosis not present

## 2020-04-05 DIAGNOSIS — U071 COVID-19: Secondary | ICD-10-CM | POA: Diagnosis not present

## 2020-04-05 DIAGNOSIS — N183 Chronic kidney disease, stage 3 unspecified: Secondary | ICD-10-CM | POA: Diagnosis not present

## 2020-04-05 DIAGNOSIS — I482 Chronic atrial fibrillation, unspecified: Secondary | ICD-10-CM | POA: Diagnosis not present

## 2020-04-05 LAB — CBC
HCT: 24.9 % — ABNORMAL LOW (ref 39.0–52.0)
Hemoglobin: 7.6 g/dL — ABNORMAL LOW (ref 13.0–17.0)
MCH: 27.8 pg (ref 26.0–34.0)
MCHC: 30.5 g/dL (ref 30.0–36.0)
MCV: 91.2 fL (ref 80.0–100.0)
Platelets: 420 10*3/uL — ABNORMAL HIGH (ref 150–400)
RBC: 2.73 MIL/uL — ABNORMAL LOW (ref 4.22–5.81)
RDW: 18.2 % — ABNORMAL HIGH (ref 11.5–15.5)
WBC: 9.2 10*3/uL (ref 4.0–10.5)
nRBC: 0 % (ref 0.0–0.2)

## 2020-04-05 LAB — BASIC METABOLIC PANEL
Anion gap: 11 (ref 5–15)
BUN: 75 mg/dL — ABNORMAL HIGH (ref 8–23)
CO2: 35 mmol/L — ABNORMAL HIGH (ref 22–32)
Calcium: 8.8 mg/dL — ABNORMAL LOW (ref 8.9–10.3)
Chloride: 87 mmol/L — ABNORMAL LOW (ref 98–111)
Creatinine, Ser: 3.11 mg/dL — ABNORMAL HIGH (ref 0.61–1.24)
GFR calc Af Amer: 22 mL/min — ABNORMAL LOW (ref >60)
GFR calc non Af Amer: 19 mL/min — ABNORMAL LOW (ref >60)
Glucose, Bld: 126 mg/dL — ABNORMAL HIGH (ref 70–99)
Potassium: 3.3 mmol/L — ABNORMAL LOW (ref 3.5–5.1)
Sodium: 133 mmol/L — ABNORMAL LOW (ref 135–145)

## 2020-04-05 NOTE — Progress Notes (Signed)
Pulmonary Critical Care Medicine [facility] [place]   PULMONARY CRITICAL CARE SERVICE  PROGRESS NOTE  Date of Service: 04/05/2020  Russell Rios  JME:[id number]  DOB: October 03, 1946   DOA: 03/02/2020  Referring Physician: Carron Curie, MD  HPI: Russell Rios is a 74 y.o. male seen for follow up of Acute on Chronic Respiratory Failure.  Patient currently is on T collar has been on 40% FiO2 with good saturations  Medications: Reviewed on Rounds  Physical Exam:  Vitals: Temperature is 97.0 pulse 68 respiratory rate 21 blood pressure is 95/54 saturations 100%  Ventilator Settings on T collar with an FiO2 40%  . General: Comfortable at this time . Eyes: Grossly normal lids, irises & conjunctiva . ENT: grossly tongue is normal . Neck: no obvious mass . Cardiovascular: S1 S2 normal no gallop . Respiratory: No rhonchi no rales are noted at this time . Abdomen: soft . Skin: no rash seen on limited exam . Musculoskeletal: not rigid . Psychiatric:unable to assess . Neurologic: no seizure no involuntary movements         Lab Data:   Basic Metabolic Panel: Recent Labs  Lab 03/30/20 0544 04/02/20 0717 04/03/20 0246 04/04/20 0444 04/05/20 0623  NA 139 136 137 136 133*  K 3.8 3.7 3.5 3.4* 3.3*  CL 96* 93* 93* 90* 87*  CO2 33* 32 34* 34* 35*  GLUCOSE 119* 127* 118* 130* 126*  BUN 44* 62* 69* 72* 75*  CREATININE 1.44* 2.40* 2.60* 2.90* 3.11*  CALCIUM 9.3 9.2 9.1 9.2 8.8*    ABG: No results for input(s): PHART, PCO2ART, PO2ART, HCO3, O2SAT in the last 168 hours.  Liver Function Tests: Recent Labs  Lab 04/02/20 0717  ALBUMIN 1.8*   No results for input(s): LIPASE, AMYLASE in the last 168 hours. No results for input(s): AMMONIA in the last 168 hours.  CBC: Recent Labs  Lab 04/02/20 0717 04/03/20 0246 04/03/20 1747 04/04/20 0444 04/05/20 0623  WBC 11.1* 10.3 10.8* 9.9 9.2  HGB 7.6* 6.9* 8.9* 8.4* 7.6*  HCT 25.7* 22.7* 29.7* 27.0* 24.9*  MCV 94.8 94.6 91.4  91.2 91.2  PLT 469* 451* 378 460* 420*    Cardiac Enzymes: No results for input(s): CKTOTAL, CKMB, CKMBINDEX, TROPONINI in the last 168 hours.  BNP (last 3 results) No results for input(s): BNP in the last 8760 hours.  ProBNP (last 3 results) No results for input(s): PROBNP in the last 8760 hours.  Radiological Exams: DG CHEST PORT 1 VIEW  Result Date: 04/04/2020 CLINICAL DATA:  Pulmonary edema EXAM: PORTABLE CHEST 1 VIEW COMPARISON:  04/01/2020 FINDINGS: Stable positioning of tracheostomy tube. Stable cardiac silhouette. Persistent coarsened interstitial opacities throughout the mid to lower lung fields bilaterally compatible with chronic fibrotic lung changes. No new superimposed airspace opacity is evident. No large pleural fluid collection. No pneumothorax. IMPRESSION: Chronic fibrotic lung disease without evidence of new superimposed airspace opacity identified. Electronically Signed   By: Duanne Guess D.O.   On: 04/04/2020 15:11    Assessment/Plan Active Problems:   Acute on chronic respiratory failure with hypoxia (HCC)   COVID-19 virus infection   Chronic atrial fibrillation with rapid ventricular response (HCC)   Chronic kidney disease, stage III (moderate)   Healthcare-associated pneumonia   1. Acute on chronic respiratory failure hypoxia we will continue with T collar trials titrate oxygen as tolerated continue secretion management supportive care. 2. COVID-19 virus infection resolved we will continue supportive care 3. Chronic atrial fibrillation rate controlled 4. Chronic kidney disease stage III  at baseline following labs 5. Healthcare associated pneumonia treated clinically is improving.  He has no evidence of any new airspace disease noted   I have personally seen and evaluated the patient, evaluated laboratory and imaging results, formulated the assessment and plan and placed orders. The Patient requires high complexity decision making with multiple systems  involvement.  Rounds were done with the Respiratory Therapy Director and Staff therapists and discussed with nursing staff also.  Allyne Gee, MD [facility] Pulmonary Critical Care Medicine Sleep Medicine

## 2020-04-06 DIAGNOSIS — I482 Chronic atrial fibrillation, unspecified: Secondary | ICD-10-CM | POA: Diagnosis not present

## 2020-04-06 DIAGNOSIS — N183 Chronic kidney disease, stage 3 unspecified: Secondary | ICD-10-CM | POA: Diagnosis not present

## 2020-04-06 DIAGNOSIS — J9621 Acute and chronic respiratory failure with hypoxia: Secondary | ICD-10-CM | POA: Diagnosis not present

## 2020-04-06 DIAGNOSIS — U071 COVID-19: Secondary | ICD-10-CM | POA: Diagnosis not present

## 2020-04-06 LAB — BASIC METABOLIC PANEL
Anion gap: 17 — ABNORMAL HIGH (ref 5–15)
BUN: 80 mg/dL — ABNORMAL HIGH (ref 8–23)
CO2: 34 mmol/L — ABNORMAL HIGH (ref 22–32)
Calcium: 9.2 mg/dL (ref 8.9–10.3)
Chloride: 81 mmol/L — ABNORMAL LOW (ref 98–111)
Creatinine, Ser: 3.22 mg/dL — ABNORMAL HIGH (ref 0.61–1.24)
GFR calc Af Amer: 21 mL/min — ABNORMAL LOW (ref >60)
GFR calc non Af Amer: 18 mL/min — ABNORMAL LOW (ref >60)
Glucose, Bld: 133 mg/dL — ABNORMAL HIGH (ref 70–99)
Potassium: 3.6 mmol/L (ref 3.5–5.1)
Sodium: 132 mmol/L — ABNORMAL LOW (ref 135–145)

## 2020-04-06 LAB — CBC
HCT: 27 % — ABNORMAL LOW (ref 39.0–52.0)
Hemoglobin: 8.1 g/dL — ABNORMAL LOW (ref 13.0–17.0)
MCH: 27.2 pg (ref 26.0–34.0)
MCHC: 30 g/dL (ref 30.0–36.0)
MCV: 90.6 fL (ref 80.0–100.0)
Platelets: 427 10*3/uL — ABNORMAL HIGH (ref 150–400)
RBC: 2.98 MIL/uL — ABNORMAL LOW (ref 4.22–5.81)
RDW: 18 % — ABNORMAL HIGH (ref 11.5–15.5)
WBC: 11.6 10*3/uL — ABNORMAL HIGH (ref 4.0–10.5)
nRBC: 0 % (ref 0.0–0.2)

## 2020-04-06 NOTE — Progress Notes (Signed)
Pulmonary Critical Care Medicine [place]   PULMONARY CRITICAL CARE SERVICE  PROGRESS NOTE  Date of Service: 04/06/2020  Bing Duffey  QIO:[id number]  DOB: Sep 26, 1946   DOA: 03/02/2020  Referring Physician: Merton Border, MD  HPI: Russell Rios is a 74 y.o. male seen for follow up of Acute on Chronic Respiratory Failure.  Patient currently is on T collar has been on 35% FiO2 looks better today spoke with respiratory therapy possibly changing the tracheostomy out  Medications: Reviewed on Rounds  Physical Exam:  Vitals: Temperature 97.4 pulse 80 respiratory 20 blood pressure is 106/51 saturations 100%  Ventilator Settings on T collar FiO2 is 35%  . General: Comfortable at this time . Eyes: Grossly normal lids, irises & conjunctiva . ENT: grossly tongue is normal . Neck: no obvious mass . Cardiovascular: S1 S2 normal no gallop . Respiratory: No rhonchi no rales are noted at this time . Abdomen: soft . Skin: no rash seen on limited exam . Musculoskeletal: not rigid . Psychiatric:unable to assess . Neurologic: no seizure no involuntary movements         Lab Data:   Basic Metabolic Panel: Recent Labs  Lab 04/02/20 0717 04/03/20 0246 04/04/20 0444 04/05/20 0623 04/06/20 0537  NA 136 137 136 133* 132*  K 3.7 3.5 3.4* 3.3* 3.6  CL 93* 93* 90* 87* 81*  CO2 32 34* 34* 35* 34*  GLUCOSE 127* 118* 130* 126* 133*  BUN 62* 69* 72* 75* 80*  CREATININE 2.40* 2.60* 2.90* 3.11* 3.22*  CALCIUM 9.2 9.1 9.2 8.8* 9.2    ABG: No results for input(s): PHART, PCO2ART, PO2ART, HCO3, O2SAT in the last 168 hours.  Liver Function Tests: Recent Labs  Lab 04/02/20 0717  ALBUMIN 1.8*   No results for input(s): LIPASE, AMYLASE in the last 168 hours. No results for input(s): AMMONIA in the last 168 hours.  CBC: Recent Labs  Lab 04/03/20 0246 04/03/20 1747 04/04/20 0444 04/05/20 0623 04/06/20 0537  WBC 10.3 10.8* 9.9 9.2 11.6*  HGB 6.9* 8.9* 8.4* 7.6* 8.1*   HCT 22.7* 29.7* 27.0* 24.9* 27.0*  MCV 94.6 91.4 91.2 91.2 90.6  PLT 451* 378 460* 420* 427*    Cardiac Enzymes: No results for input(s): CKTOTAL, CKMB, CKMBINDEX, TROPONINI in the last 168 hours.  BNP (last 3 results) No results for input(s): BNP in the last 8760 hours.  ProBNP (last 3 results) No results for input(s): PROBNP in the last 8760 hours.  Radiological Exams: DG CHEST PORT 1 VIEW  Result Date: 04/04/2020 CLINICAL DATA:  Pulmonary edema EXAM: PORTABLE CHEST 1 VIEW COMPARISON:  04/01/2020 FINDINGS: Stable positioning of tracheostomy tube. Stable cardiac silhouette. Persistent coarsened interstitial opacities throughout the mid to lower lung fields bilaterally compatible with chronic fibrotic lung changes. No new superimposed airspace opacity is evident. No large pleural fluid collection. No pneumothorax. IMPRESSION: Chronic fibrotic lung disease without evidence of new superimposed airspace opacity identified. Electronically Signed   By: Davina Poke D.O.   On: 04/04/2020 15:11    Assessment/Plan Active Problems:   Acute on chronic respiratory failure with hypoxia (HCC)   COVID-19 virus infection   Chronic atrial fibrillation with rapid ventricular response (HCC)   Chronic kidney disease, stage III (moderate)   Healthcare-associated pneumonia   1. Acute on chronic respiratory failure hypoxia plan is to continue with T collar trials titrate oxygen continue pulmonary toilet 2. COVID-19 virus infection resolved 3. Chronic atrial fibrillation rate is controlled at this time 4. Chronic kidney disease  stage III following labs 5. Healthcare associated pneumonia treated clinically improving patient has some chronic fibrotic changes on the chest films   I have personally seen and evaluated the patient, evaluated laboratory and imaging results, formulated the assessment and plan and placed orders. The Patient requires high complexity decision making with multiple systems  involvement.  Rounds were done with the Respiratory Therapy Director and Staff therapists and discussed with nursing staff also.  Yevonne Pax, MD [facility] Pulmonary Critical Care Medicine Sleep Medicine

## 2020-04-07 DIAGNOSIS — J9621 Acute and chronic respiratory failure with hypoxia: Secondary | ICD-10-CM | POA: Diagnosis not present

## 2020-04-07 DIAGNOSIS — N183 Chronic kidney disease, stage 3 unspecified: Secondary | ICD-10-CM | POA: Diagnosis not present

## 2020-04-07 DIAGNOSIS — U071 COVID-19: Secondary | ICD-10-CM | POA: Diagnosis not present

## 2020-04-07 DIAGNOSIS — I482 Chronic atrial fibrillation, unspecified: Secondary | ICD-10-CM | POA: Diagnosis not present

## 2020-04-07 NOTE — Progress Notes (Addendum)
Pulmonary Critical Care Medicine [facility] [place]   PULMONARY CRITICAL CARE SERVICE  PROGRESS NOTE  Date of Service: 04/07/2020  Russell Rios  RKY:[id number]  DOB: [birth year]   DOA: 03/02/2020  Referring Physician: Carron Curie, MD  HPI: Russell Rios is a 74 y.o. male seen for follow up of Acute on Chronic Respiratory Failure.  Patient remains on 30% aerosol trach using PMV with no difficulty satting well no distress  Medications: Reviewed on Rounds  Physical Exam:  Vitals: Pulse 74 respiration 22 BP 12/10/1959 O2 sat 96% temp 99.6  Ventilator Settings ATC 30%  . General: Comfortable at this time . Eyes: Grossly normal lids, irises & conjunctiva . ENT: grossly tongue is normal . Neck: no obvious mass . Cardiovascular: S1 S2 normal no gallop . Respiratory: No rales or rhonchi noted . Abdomen: soft . Skin: no rash seen on limited exam . Musculoskeletal: not rigid . Psychiatric:unable to assess . Neurologic: no seizure no involuntary movements         Lab Data:   Basic Metabolic Panel: Recent Labs  Lab 04/02/20 0717 04/03/20 0246 04/04/20 0444 04/05/20 0623 04/06/20 0537  NA 136 137 136 133* 132*  K 3.7 3.5 3.4* 3.3* 3.6  CL 93* 93* 90* 87* 81*  CO2 32 34* 34* 35* 34*  GLUCOSE 127* 118* 130* 126* 133*  BUN 62* 69* 72* 75* 80*  CREATININE 2.40* 2.60* 2.90* 3.11* 3.22*  CALCIUM 9.2 9.1 9.2 8.8* 9.2    ABG: No results for input(s): PHART, PCO2ART, PO2ART, HCO3, O2SAT in the last 168 hours.  Liver Function Tests: Recent Labs  Lab 04/02/20 0717  ALBUMIN 1.8*   No results for input(s): LIPASE, AMYLASE in the last 168 hours. No results for input(s): AMMONIA in the last 168 hours.  CBC: Recent Labs  Lab 04/03/20 0246 04/03/20 1747 04/04/20 0444 04/05/20 0623 04/06/20 0537  WBC 10.3 10.8* 9.9 9.2 11.6*  HGB 6.9* 8.9* 8.4* 7.6* 8.1*  HCT 22.7* 29.7* 27.0* 24.9* 27.0*  MCV 94.6 91.4 91.2 91.2 90.6  PLT 451* 378 460* 420* 427*    Cardiac  Enzymes: No results for input(s): CKTOTAL, CKMB, CKMBINDEX, TROPONINI in the last 168 hours.  BNP (last 3 results) No results for input(s): BNP in the last 8760 hours.  ProBNP (last 3 results) No results for input(s): PROBNP in the last 8760 hours.  Radiological Exams: No results found.  Assessment/Plan Active Problems:   Acute on chronic respiratory failure with hypoxia (HCC)   COVID-19 virus infection   Chronic atrial fibrillation with rapid ventricular response (HCC)   Chronic kidney disease, stage III (moderate)   Healthcare-associated pneumonia   1. Acute on chronic respiratory failure hypoxia patient will remain on 30% aerosol trach collar at this time using PMV.  We will continue aggressive pulmonary toilet supportive shoes. 2. COVID-19 virus infection resolved 3. Chronic atrial fibrillation rate is controlled at this time 4. Chronic kidney disease stage III following labs 5. Healthcare associated pneumonia treated clinically improving patient has some chronic fibrotic changes on the chest films   I have personally seen and evaluated the patient, evaluated laboratory and imaging results, formulated the assessment and plan and placed orders. The Patient requires high complexity decision making with multiple systems involvement.  Rounds were done with the Respiratory Therapy Director and Staff therapists and discussed with nursing staff also.  Yevonne Pax, MD [facility] Pulmonary Critical Care Medicine Sleep Medicine

## 2020-04-07 NOTE — Consult Note (Signed)
[facility] CONSULT NOTE    Date: 04/07/2020                  Patient Name:  Russell Rios  MRN: [id number]  DOB: [birth year]  Age / Sex: 74 y.o., male         PCP: No primary care provider on file.                 Service Requesting Consult:  Hospitalist                 Reason for Consult:  Acute kidney injury            History of Present Illness: Patient is a 74 y.o. male with a PMHx of abdominal aortic aneurysm, right hepatic lobe lesion, cystic mass left kidney, BPH, history of tobacco abuse, hypertension, hyperlipidemia, COVID-19 infection,, acute respiratory failure secondary to COVID-19 infection and Pseudomonas who was admitted to [facility] on 03/02/2020 for ongoing management.  He was admitted to [facility] from 01/08/2020 till 03/02/2020.  He had prolonged course there.  As above he had CVELF-81 pneumonia complicated by pseudomonal infection thereafter.  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  We are now asked to see him for evaluation management of acute kidney injury.  His baseline creatinine appears to be 1.1.  Currently BUN is up to 80 with a creatinine of 3.2.  He also has mild hyponatremia with serum sodium of 132.  Noted to be on D5W at the moment.  Patient did have contrast exposure on 03/24/2020.  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   Medications: Outpatient medications: No medications prior to admission.    Current medications: Current Facility-Administered Medications  Medication Dose Route Frequency Provider Last Rate Last Admin  . iohexol (OMNIPAQUE) 300 MG/ML solution 50 mL  50 mL Per Tube Once PRN Merton Border, MD          Allergies: Allergies  Allergen Reactions  . Atorvastatin   . Lisinopril     angioedema  . Simvastatin Rash      Past Medical History: Past Medical History:  Diagnosis Date  . Acute on chronic respiratory failure with hypoxia ([place])   . Chronic atrial  fibrillation with rapid ventricular response ([place])   . Chronic kidney disease, stage III (moderate)   . COVID-19 virus infection   . Healthcare-associated pneumonia      Past Surgical History: Past Surgical History:  Procedure Laterality Date  . IR IVC FILTER PLMT / S&I /IMG GUID/MOD SED  03/25/2020     Family History: History reviewed. No pertinent family history.   Social History: Social History   Socioeconomic History  . Marital status: Married    Spouse name: Not on file  . Number of children: Not on file  . Years of education: Not on file  . Highest education level: Not on file  Occupational History  . Not on file  Tobacco Use  . Smoking status: Not on file  Substance and Sexual Activity  . Alcohol use: Not on file  . Drug use: Not on file  . Sexual activity: Not on file  Other Topics Concern  . Not on file  Social History Narrative  . Not on file   Social Determinants of Health   Financial Resource Strain:   . Difficulty of Paying Living Expenses:   Food Insecurity:   . Worried About Charity fundraiser in  the Last Year:   . [place] in the Last Year:   Transportation Needs:   . Film/video editor (Medical):   Marland Kitchen Lack of Transportation (Non-Medical):   Physical Activity:   . Days of Exercise per Week:   . Minutes of Exercise per Session:   Stress:   . Feeling of Stress :   Social Connections:   . Frequency of Communication with Friends and Family:   . Frequency of Social Gatherings with Friends and Family:   . Attends Religious Services:   . Active Member of Clubs or Organizations:   . Attends Archivist Meetings:   Marland Kitchen Marital Status:   Intimate Partner Violence:   . Fear of Current or Ex-Partner:   . Emotionally Abused:   Marland Kitchen Physically Abused:   . Sexually Abused:      Review of Systems: Unable to obtain from the patient as he has difficulty verbalizing with tracheostomy in place  Vital Signs: Temperature 97.6 pulse 74  respirations 20 blood pressure 110/61 Weight trends: There were no vitals filed for this visit.  Physical Exam: General: NAD, resting in bed  Head: Normocephalic, atraumatic.  Eyes: Anicteric, EOMI  Nose: Mucous membranes moist, not inflammed, nonerythematous.  Throat: Oropharynx nonerythematous, no exudate appreciated.   Neck: Tracheostomy in place  Lungs:  Normal respiratory effort.  Scattered rhonchi bilateral.  Heart: RRR. S1 and S2 normal without gallop, murmur, or rubs.  Abdomen:  Soft NTND, BS present, PEG present  Extremities: 2+ pretibial edema.  Neurologic: Awake, alert, follows simple commands  Skin: No visible rashes, scars.    Lab results: Basic Metabolic Panel: Recent Labs  Lab 04/04/20 0444 04/05/20 0623 04/06/20 0537  NA 136 133* 132*  K 3.4* 3.3* 3.6  CL 90* 87* 81*  CO2 34* 35* 34*  GLUCOSE 130* 126* 133*  BUN 72* 75* 80*  CREATININE 2.90* 3.11* 3.22*  CALCIUM 9.2 8.8* 9.2    Liver Function Tests: Recent Labs  Lab 04/02/20 0717  ALBUMIN 1.8*   No results for input(s): LIPASE, AMYLASE in the last 168 hours. No results for input(s): AMMONIA in the last 168 hours.  CBC: Recent Labs  Lab 04/03/20 0246 04/03/20 0246 04/03/20 1747 04/03/20 1747 04/04/20 0444 04/05/20 0623 04/06/20 0537  WBC 10.3   < > 10.8*   < > 9.9 9.2 11.6*  HGB 6.9*   < > 8.9*   < > 8.4* 7.6* 8.1*  HCT 22.7*   < > 29.7*   < > 27.0* 24.9* 27.0*  MCV 94.6  --  91.4  --  91.2 91.2 90.6  PLT 451*   < > 378   < > 460* 420* 427*   < > = values in this interval not displayed.    Cardiac Enzymes: No results for input(s): CKTOTAL, CKMB, CKMBINDEX, TROPONINI in the last 168 hours.  BNP: Invalid input(s): POCBNP  CBG: No results for input(s): GLUCAP in the last 168 hours.  Microbiology: Results for orders placed or performed during the hospital encounter of 03/02/20  Culture, respiratory     Status: None (Preliminary result)   Collection Time: 03/19/20  1:35 PM    Specimen: Tracheal Aspirate  Result Value Ref Range Status   Specimen Description TRACHEAL ASPIRATE  Final   Special Requests NONE  Final   Gram Stain   Final    NO WBC SEEN FEW SQUAMOUS EPITHELIAL CELLS PRESENT NO ORGANISMS SEEN    Culture   Final  ABUNDANT PSEUDOMONAS AERUGINOSA TO BE SENT OUT FOR FURTHER TESTING Performed at [facility], [place] [address] [place], [place] [address]    Report Status PENDING  Incomplete   Organism ID, Bacteria PSEUDOMONAS AERUGINOSA  Final      Susceptibility   Pseudomonas aeruginosa - MIC*    CEFTAZIDIME >=64 RESISTANT Resistant     CIPROFLOXACIN >=4 RESISTANT Resistant     GENTAMICIN 4 SENSITIVE Sensitive     IMIPENEM >=16 RESISTANT Resistant     CEFEPIME >=64 RESISTANT Resistant     * ABUNDANT PSEUDOMONAS AERUGINOSA  Culture, respiratory (non-expectorated)     Status: None   Collection Time: 03/20/20 10:17 AM   Specimen: Tracheal Aspirate; Respiratory  Result Value Ref Range Status   Specimen Description TRACHEAL ASPIRATE  Final   Special Requests NONE  Final   Gram Stain   Final    NO WBC SEEN RARE GRAM POSITIVE COCCI IN PAIRS FEW GRAM VARIABLE ROD Performed at [facility], [place] [address], [place], [place] [address]    Culture ABUNDANT PSEUDOMONAS AERUGINOSA  Final   Report Status 03/22/2020 FINAL  Final   Organism ID, Bacteria PSEUDOMONAS AERUGINOSA  Final      Susceptibility   Pseudomonas aeruginosa - MIC*    CEFTAZIDIME >=64 RESISTANT Resistant     CIPROFLOXACIN 2 INTERMEDIATE Intermediate     GENTAMICIN 8 INTERMEDIATE Intermediate     IMIPENEM >=16 RESISTANT Resistant     CEFEPIME >=64 RESISTANT Resistant     * ABUNDANT PSEUDOMONAS AERUGINOSA    Coagulation Studies: No results for input(s): LABPROT, INR in the last 72 hours.  Urinalysis: No results for input(s): COLORURINE, LABSPEC, PHURINE, GLUCOSEU, HGBUR, BILIRUBINUR, KETONESUR, PROTEINUR, UROBILINOGEN, NITRITE, LEUKOCYTESUR in the last 72  hours.  Invalid input(s): APPERANCEUR    Imaging:  No results found.   Assessment & Plan: Pt is a 74 y.o. male with a PMHx of abdominal aortic aneurysm, right hepatic lobe lesion, cystic mass left kidney, BPH, history of tobacco abuse, hypertension, hyperlipidemia, COVID-19 infection,, acute respiratory failure secondary to COVID-19 infection and Pseudomonas who was admitted to [facility] on 03/02/2020 for ongoing management.  1.  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  Suspect that the patient has developed acute tubular necrosis.  He was also exposed to contrast on 03/24/2020.  DC current IV fluids in favor of 0.9 normal saline at 50 cc/h.  Check renal ultrasound as well as SPEP and UPEP as well as ANCA antibodies and GBM antibodies.  No acute indication for dialysis as he is making plenty of urine at the moment as urine output was 2 L over the preceding 24 hours.  Continue to monitor renal parameters over the course of hospitalization and avoid nephrotoxins as possible.  2.  Anemia of chronic kidney disease.  Hemoglobin currently 8.1.  Hold off on Epogen at this time.  3.  Hyponatremia.  Suspect secondary to free water administration and acute kidney dysfunction.  Change IV fluids to 0.9 normal saline.  4.  Thanks for consultation.

## 2020-04-08 DIAGNOSIS — N183 Chronic kidney disease, stage 3 unspecified: Secondary | ICD-10-CM | POA: Diagnosis not present

## 2020-04-08 DIAGNOSIS — U071 COVID-19: Secondary | ICD-10-CM | POA: Diagnosis not present

## 2020-04-08 DIAGNOSIS — I482 Chronic atrial fibrillation, unspecified: Secondary | ICD-10-CM | POA: Diagnosis not present

## 2020-04-08 DIAGNOSIS — J9621 Acute and chronic respiratory failure with hypoxia: Secondary | ICD-10-CM | POA: Diagnosis not present

## 2020-04-08 NOTE — Progress Notes (Signed)
Pulmonary Critical Care Medicine [facility] [place]   PULMONARY CRITICAL CARE SERVICE  PROGRESS NOTE  Date of Service: 04/08/2020  Russell Rios  UXL:[id number]  DOB: [birth year]   DOA: 03/02/2020  Referring Physician: Carron Curie, MD  HPI: Russell Rios is a 74 y.o. male seen for follow up of Acute on Chronic Respiratory Failure.  Patient currently is on T collar on 30% FiO2 possible discharge early next week right now is tolerating PMV well  Medications: Reviewed on Rounds  Physical Exam:  Vitals: Temperature 96.8 pulse 73 respiratory 22 blood pressure is 92/51 saturations 99%  Ventilator Settings on T collar with an FiO2 of 30%  . General: Comfortable at this time . Eyes: Grossly normal lids, irises & conjunctiva . ENT: grossly tongue is normal . Neck: no obvious mass . Cardiovascular: S1 S2 normal no gallop . Respiratory: No rhonchi coarse breath sounds are noted at this time . Abdomen: soft . Skin: no rash seen on limited exam . Musculoskeletal: not rigid . Psychiatric:unable to assess . Neurologic: no seizure no involuntary movements         Lab Data:   Basic Metabolic Panel: Recent Labs  Lab 04/02/20 0717 04/03/20 0246 04/04/20 0444 04/05/20 0623 04/06/20 0537  NA 136 137 136 133* 132*  K 3.7 3.5 3.4* 3.3* 3.6  CL 93* 93* 90* 87* 81*  CO2 32 34* 34* 35* 34*  GLUCOSE 127* 118* 130* 126* 133*  BUN 62* 69* 72* 75* 80*  CREATININE 2.40* 2.60* 2.90* 3.11* 3.22*  CALCIUM 9.2 9.1 9.2 8.8* 9.2    ABG: No results for input(s): PHART, PCO2ART, PO2ART, HCO3, O2SAT in the last 168 hours.  Liver Function Tests: Recent Labs  Lab 04/02/20 0717  ALBUMIN 1.8*   No results for input(s): LIPASE, AMYLASE in the last 168 hours. No results for input(s): AMMONIA in the last 168 hours.  CBC: Recent Labs  Lab 04/03/20 0246 04/03/20 1747 04/04/20 0444 04/05/20 0623 04/06/20 0537  WBC 10.3 10.8* 9.9 9.2 11.6*  HGB 6.9* 8.9* 8.4* 7.6* 8.1*  HCT 22.7*  29.7* 27.0* 24.9* 27.0*  MCV 94.6 91.4 91.2 91.2 90.6  PLT 451* 378 460* 420* 427*    Cardiac Enzymes: No results for input(s): CKTOTAL, CKMB, CKMBINDEX, TROPONINI in the last 168 hours.  BNP (last 3 results) No results for input(s): BNP in the last 8760 hours.  ProBNP (last 3 results) No results for input(s): PROBNP in the last 8760 hours.  Radiological Exams: No results found.  Assessment/Plan Active Problems:   Acute on chronic respiratory failure with hypoxia (HCC)   COVID-19 virus infection   Chronic atrial fibrillation with rapid ventricular response (HCC)   Chronic kidney disease, stage III (moderate)   Healthcare-associated pneumonia   1. Acute on chronic respiratory failure hypoxia plan is to continue with T collar trials on 30% FiO2 using PMV 2. COVID-19 virus infection treated in resolution phase 3. Chronic atrial fibrillation rate controlled 4. Kidney disease stage III following labs 5. Healthcare associated pneumonia treated resolved   I have personally seen and evaluated the patient, evaluated laboratory and imaging results, formulated the assessment and plan and placed orders. The Patient requires high complexity decision making with multiple systems involvement.  Rounds were done with the Respiratory Therapy Director and Staff therapists and discussed with nursing staff also.  Yevonne Pax, MD [facility] Pulmonary Critical Care Medicine Sleep Medicine

## 2020-04-09 ENCOUNTER — Other Ambulatory Visit (HOSPITAL_COMMUNITY): Payer: Medicare Other

## 2020-04-09 DIAGNOSIS — U071 COVID-19: Secondary | ICD-10-CM | POA: Diagnosis not present

## 2020-04-09 DIAGNOSIS — I482 Chronic atrial fibrillation, unspecified: Secondary | ICD-10-CM | POA: Diagnosis not present

## 2020-04-09 DIAGNOSIS — N183 Chronic kidney disease, stage 3 unspecified: Secondary | ICD-10-CM | POA: Diagnosis not present

## 2020-04-09 DIAGNOSIS — J9621 Acute and chronic respiratory failure with hypoxia: Secondary | ICD-10-CM | POA: Diagnosis not present

## 2020-04-09 LAB — RENAL FUNCTION PANEL
Albumin: 2.1 g/dL — ABNORMAL LOW (ref 3.5–5.0)
Anion gap: 13 (ref 5–15)
BUN: 128 mg/dL — ABNORMAL HIGH (ref 8–23)
CO2: 35 mmol/L — ABNORMAL HIGH (ref 22–32)
Calcium: 10.1 mg/dL (ref 8.9–10.3)
Chloride: 81 mmol/L — ABNORMAL LOW (ref 98–111)
Creatinine, Ser: 3.87 mg/dL — ABNORMAL HIGH (ref 0.61–1.24)
GFR calc Af Amer: 17 mL/min — ABNORMAL LOW (ref >60)
GFR calc non Af Amer: 14 mL/min — ABNORMAL LOW (ref >60)
Glucose, Bld: 119 mg/dL — ABNORMAL HIGH (ref 70–99)
Phosphorus: 3.8 mg/dL (ref 2.5–4.6)
Potassium: 4 mmol/L (ref 3.5–5.1)
Sodium: 129 mmol/L — ABNORMAL LOW (ref 135–145)

## 2020-04-09 NOTE — Progress Notes (Signed)
[facility]  ROUNDING NOTE   Subjective:  Patient seen and evaluated at bedside. Resting comfortably in bed at the moment. No new renal function testing today.   Objective:  Vital signs in last 24 hours:  Temperature 98 pulse 68 respiration 17 blood pressure 99/61   Physical Exam: General: No acute distress  Head: Normocephalic, atraumatic. 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  Neurologic: Awake, alert, following commands  Skin: No rash       Basic Metabolic Panel: Recent Labs  Lab 04/03/20 0246 04/03/20 0246 04/04/20 0444 04/05/20 0623 04/06/20 0537  NA 137  --  136 133* 132*  K 3.5  --  3.4* 3.3* 3.6  CL 93*  --  90* 87* 81*  CO2 34*  --  34* 35* 34*  GLUCOSE 118*  --  130* 126* 133*  BUN 69*  --  72* 75* 80*  CREATININE 2.60*  --  2.90* 3.11* 3.22*  CALCIUM 9.1   < > 9.2 8.8* 9.2   < > = values in this interval not displayed.    Liver Function Tests: No results for input(s): AST, ALT, ALKPHOS, BILITOT, PROT, ALBUMIN in the last 168 hours. No results for input(s): LIPASE, AMYLASE in the last 168 hours. No results for input(s): AMMONIA in the last 168 hours.  CBC: Recent Labs  Lab 04/03/20 0246 04/03/20 1747 04/04/20 0444 04/05/20 0623 04/06/20 0537  WBC 10.3 10.8* 9.9 9.2 11.6*  HGB 6.9* 8.9* 8.4* 7.6* 8.1*  HCT 22.7* 29.7* 27.0* 24.9* 27.0*  MCV 94.6 91.4 91.2 91.2 90.6  PLT 451* 378 460* 420* 427*    Cardiac Enzymes: No results for input(s): CKTOTAL, CKMB, CKMBINDEX, TROPONINI in the last 168 hours.  BNP: Invalid input(s): POCBNP  CBG: No results for input(s): GLUCAP in the last 168 hours.  Microbiology: Results for orders placed or performed during the hospital encounter of 03/02/20  Culture, respiratory     Status: None (Preliminary  result)   Collection Time: 03/19/20  1:35 PM   Specimen: Tracheal Aspirate  Result Value Ref Range Status   Specimen Description TRACHEAL ASPIRATE  Final   Special Requests NONE  Final   Gram Stain   Final    NO WBC SEEN FEW SQUAMOUS EPITHELIAL CELLS PRESENT NO ORGANISMS SEEN    Culture   Final    ABUNDANT PSEUDOMONAS AERUGINOSA TO BE SENT OUT FOR FURTHER TESTING Performed at [facility], [address] [place], [place] [address]    Report Status PENDING  Incomplete   Organism ID, Bacteria PSEUDOMONAS AERUGINOSA  Final      Susceptibility   Pseudomonas aeruginosa - MIC*    CEFTAZIDIME >=64 RESISTANT Resistant     CIPROFLOXACIN >=4 RESISTANT Resistant     GENTAMICIN 4 SENSITIVE Sensitive     IMIPENEM >=16 RESISTANT Resistant     CEFEPIME >=64 RESISTANT Resistant     * ABUNDANT PSEUDOMONAS AERUGINOSA  Culture, respiratory (non-expectorated)     Status: None   Collection Time: 03/20/20 10:17 AM   Specimen: Tracheal Aspirate; Respiratory  Result Value Ref Range Status   Specimen Description TRACHEAL ASPIRATE  Final   Special Requests NONE  Final   Gram Stain   Final    NO WBC SEEN RARE GRAM POSITIVE COCCI IN PAIRS FEW GRAM VARIABLE ROD  Performed at [facility], [address], [place], [place] [address]    Culture ABUNDANT PSEUDOMONAS AERUGINOSA  Final   Report Status 03/22/2020 FINAL  Final   Organism ID, Bacteria PSEUDOMONAS AERUGINOSA  Final      Susceptibility   Pseudomonas aeruginosa - MIC*    CEFTAZIDIME >=64 RESISTANT Resistant     CIPROFLOXACIN 2 INTERMEDIATE Intermediate     GENTAMICIN 8 INTERMEDIATE Intermediate     IMIPENEM >=16 RESISTANT Resistant     CEFEPIME >=64 RESISTANT Resistant     * ABUNDANT PSEUDOMONAS AERUGINOSA    Coagulation Studies: No results for input(s): LABPROT, INR in the last 72 hours.  Urinalysis: No results for input(s): COLORURINE, LABSPEC, PHURINE, GLUCOSEU, HGBUR, BILIRUBINUR, KETONESUR, PROTEINUR, UROBILINOGEN,  NITRITE, LEUKOCYTESUR in the last 72 hours.  Invalid input(s): APPERANCEUR    Imaging: No results found.   Medications:     iohexol  Assessment/ Plan:  74 y.o. male with a PMHx of abdominal aortic aneurysm, right hepatic lobe lesion, cystic mass left kidney, BPH, history of tobacco abuse, hypertension, hyperlipidemia, COVID-19 infection,, acute respiratory failure secondary to COVID-19 infection and Pseudomonas who was admitted to [facility] on 03/02/2020 for ongoing management.  1.  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  Suspect that the patient has developed acute tubular necrosis.  He was also exposed to contrast on 03/24/2020.  -Repeat renal parameters today.  Awaiting serologic work-up as well as renal ultrasound.  Continue IV fluid hydration for now.  No indication for dialysis as the patient is making 1.8 L over the preceding 24 hours.  2.  Anemia of chronic kidney disease.  Hemoglobin up to 8.1.  Continue to monitor closely.  3.  Hyponatremia.  Serum sodium 132.  Repeat serum sodium today.  Continue 0.9 normal saline.   LOS: 0 Caelynn Marshman 5/21/20217:54 AM

## 2020-04-09 NOTE — Progress Notes (Addendum)
Pulmonary Critical Care Medicine [place]   PULMONARY CRITICAL CARE SERVICE  PROGRESS NOTE  Date of Service: 04/09/2020  Bird Swetz  UTM:[id number]  DOB: 03-11-1946   DOA: 03/02/2020  Referring Physician: Merton Border, MD  HPI: Russell Rios is a 74 y.o. male seen for follow up of Acute on Chronic Respiratory Failure.  Patient remains on aerosol trach collar 40% FiO2 using PMV with no difficulty.  Medications: Reviewed on Rounds  Physical Exam:  Vitals: Pulse 68 respirations 17 BP 99/61 O2 sat 94% temp 98.0  Ventilator Settings 40% ATC  . General: Comfortable at this time . Eyes: Grossly normal lids, irises & conjunctiva . ENT: grossly tongue is normal . Neck: no obvious mass . Cardiovascular: S1 S2 normal no gallop . Respiratory: No rales or rhonchi noted . Abdomen: soft . Skin: no rash seen on limited exam . Musculoskeletal: not rigid . Psychiatric:unable to assess . Neurologic: no seizure no involuntary movements         Lab Data:   Basic Metabolic Panel: Recent Labs  Lab 04/03/20 0246 04/04/20 0444 04/05/20 0623 04/06/20 0537 04/09/20 0833  NA 137 136 133* 132* 129*  K 3.5 3.4* 3.3* 3.6 4.0  CL 93* 90* 87* 81* 81*  CO2 34* 34* 35* 34* 35*  GLUCOSE 118* 130* 126* 133* 119*  BUN 69* 72* 75* 80* 128*  CREATININE 2.60* 2.90* 3.11* 3.22* 3.87*  CALCIUM 9.1 9.2 8.8* 9.2 10.1  PHOS  --   --   --   --  3.8    ABG: No results for input(s): PHART, PCO2ART, PO2ART, HCO3, O2SAT in the last 168 hours.  Liver Function Tests: Recent Labs  Lab 04/09/20 0833  ALBUMIN 2.1*   No results for input(s): LIPASE, AMYLASE in the last 168 hours. No results for input(s): AMMONIA in the last 168 hours.  CBC: Recent Labs  Lab 04/03/20 0246 04/03/20 1747 04/04/20 0444 04/05/20 0623 04/06/20 0537  WBC 10.3 10.8* 9.9 9.2 11.6*  HGB 6.9* 8.9* 8.4* 7.6* 8.1*  HCT 22.7* 29.7* 27.0* 24.9* 27.0*  MCV 94.6 91.4 91.2 91.2 90.6  PLT 451* 378 460* 420*  427*    Cardiac Enzymes: No results for input(s): CKTOTAL, CKMB, CKMBINDEX, TROPONINI in the last 168 hours.  BNP (last 3 results) No results for input(s): BNP in the last 8760 hours.  ProBNP (last 3 results) No results for input(s): PROBNP in the last 8760 hours.  Radiological Exams: US RENAL  Result Date: 04/09/2020 CLINICAL DATA:  Acute kidney failure EXAM: RENAL / URINARY TRACT ULTRASOUND COMPLETE COMPARISON:  03/08/2020 FINDINGS: Right Kidney: Renal measurements: 11.4 x 6.0 x 6.5 cm = volume: 233.1 mL . Echogenicity within normal limits. No mass or hydronephrosis visualized. Left Kidney: Renal measurements: 11.8 x 6.0 x 8.1 cm = volume: 302.8 mL. Echogenicity within normal limits. No mass or hydronephrosis visualized. Bladder: Appears normal for degree of bladder distention. Other: None. IMPRESSION: 1. Normal renal sonogram. Electronically Signed   By: Kerby Moors M.D.   On: 04/09/2020 09:51   DG Chest Port 1 View  Result Date: 04/09/2020 CLINICAL DATA:  Central line placement. EXAM: PORTABLE CHEST 1 VIEW COMPARISON:  04/04/2020.  03/02/2020. FINDINGS: Tracheostomy tube noted in stable position. Right IJ line noted with tip over cavoatrial junction. Heart size normal. Bilateral interstitial markings again noted without interim change. Findings consistent chronic interstitial lung disease. No acute alveolar infiltrate. No pleural effusion or pneumothorax. Heart size stable. Degenerative change thoracic spine. IMPRESSION: 1. Tracheostomy  tube stable position. Right IJ line noted with tip over cavoatrial junction. No evidence of pneumothorax. 2.  Chronic interstitial lung disease.  No interim change. Electronically Signed   By: Maisie Fus  Register   On: 04/09/2020 13:35    Assessment/Plan Active Problems:   Acute on chronic respiratory failure with hypoxia (HCC)   COVID-19 virus infection   Chronic atrial fibrillation with rapid ventricular response (HCC)   Chronic kidney disease, stage  III (moderate)   Healthcare-associated pneumonia   1. Acute on chronic respiratory failure hypoxia plan is to continue with T collar trials on 40% FiO2 using PMV 2. COVID-19 virus infection treated in resolution phase 3. Chronic atrial fibrillation rate controlled 4. Kidney disease stage III following labs 5. Healthcare associated pneumonia treated resolved    I have personally seen and evaluated the patient, evaluated laboratory and imaging results, formulated the assessment and plan and placed orders. The Patient requires high complexity decision making with multiple systems involvement.  Rounds were done with the Respiratory Therapy Director and Staff therapists and discussed with nursing staff also.  Yevonne Pax, MD [facility] Pulmonary Critical Care Medicine Sleep Medicine

## 2020-04-10 DIAGNOSIS — U071 COVID-19: Secondary | ICD-10-CM | POA: Diagnosis not present

## 2020-04-10 DIAGNOSIS — J9621 Acute and chronic respiratory failure with hypoxia: Secondary | ICD-10-CM | POA: Diagnosis not present

## 2020-04-10 DIAGNOSIS — N183 Chronic kidney disease, stage 3 unspecified: Secondary | ICD-10-CM | POA: Diagnosis not present

## 2020-04-10 DIAGNOSIS — I482 Chronic atrial fibrillation, unspecified: Secondary | ICD-10-CM | POA: Diagnosis not present

## 2020-04-10 LAB — C3 COMPLEMENT: C3 Complement: 146 mg/dL (ref 82–167)

## 2020-04-10 LAB — CULTURE, RESPIRATORY W GRAM STAIN: Gram Stain: NONE SEEN

## 2020-04-10 LAB — C4 COMPLEMENT: Complement C4, Body Fluid: 31 mg/dL (ref 12–38)

## 2020-04-10 LAB — ANA W/REFLEX IF POSITIVE: Anti Nuclear Antibody (ANA): NEGATIVE

## 2020-04-10 NOTE — Progress Notes (Signed)
Pulmonary Critical Care Medicine [place]   PULMONARY CRITICAL CARE SERVICE  PROGRESS NOTE  Date of Service: 04/10/2020  Russell Rios  QQI:[id number]  DOB: [birth year]   DOA: 03/02/2020  Referring Physician: Merton Border, MD  HPI: Russell Rios is a 74 y.o. male seen for follow up of Acute on Chronic Respiratory Failure. Patient is off the ventilator on T collar currently on 55% FiO2 good saturations secretions appear to be doing a little bit better reportedly.  Medications: Reviewed on Rounds  Physical Exam:  Vitals: Temperature is 97.0 pulse 71 respiratory 22 blood pressure is 90/51 saturations 99%  Ventilator Settings on T collar FiO2 is 35%  . General: Comfortable at this time . Eyes: Grossly normal lids, irises & conjunctiva . ENT: grossly tongue is normal . Neck: no obvious mass . Cardiovascular: S1 S2 normal no gallop . Respiratory: No rhonchi no rales are noted . Abdomen: soft . Skin: no rash seen on limited exam . Musculoskeletal: not rigid . Psychiatric:unable to assess . Neurologic: no seizure no involuntary movements         Lab Data:   Basic Metabolic Panel: Recent Labs  Lab 04/04/20 0444 04/05/20 0623 04/06/20 0537 04/09/20 0833  NA 136 133* 132* 129*  K 3.4* 3.3* 3.6 4.0  CL 90* 87* 81* 81*  CO2 34* 35* 34* 35*  GLUCOSE 130* 126* 133* 119*  BUN 72* 75* 80* 128*  CREATININE 2.90* 3.11* 3.22* 3.87*  CALCIUM 9.2 8.8* 9.2 10.1  PHOS  --   --   --  3.8    ABG: No results for input(s): PHART, PCO2ART, PO2ART, HCO3, O2SAT in the last 168 hours.  Liver Function Tests: Recent Labs  Lab 04/09/20 0833  ALBUMIN 2.1*   No results for input(s): LIPASE, AMYLASE in the last 168 hours. No results for input(s): AMMONIA in the last 168 hours.  CBC: Recent Labs  Lab 04/04/20 0444 04/05/20 0623 04/06/20 0537  WBC 9.9 9.2 11.6*  HGB 8.4* 7.6* 8.1*  HCT 27.0* 24.9* 27.0*  MCV 91.2 91.2 90.6  PLT 460* 420* 427*    Cardiac  Enzymes: No results for input(s): CKTOTAL, CKMB, CKMBINDEX, TROPONINI in the last 168 hours.  BNP (last 3 results) No results for input(s): BNP in the last 8760 hours.  ProBNP (last 3 results) No results for input(s): PROBNP in the last 8760 hours.  Radiological Exams: US RENAL  Result Date: 04/09/2020 CLINICAL DATA:  Acute kidney failure EXAM: RENAL / URINARY TRACT ULTRASOUND COMPLETE COMPARISON:  03/08/2020 FINDINGS: Right Kidney: Renal measurements: 11.4 x 6.0 x 6.5 cm = volume: 233.1 mL . Echogenicity within normal limits. No mass or hydronephrosis visualized. Left Kidney: Renal measurements: 11.8 x 6.0 x 8.1 cm = volume: 302.8 mL. Echogenicity within normal limits. No mass or hydronephrosis visualized. Bladder: Appears normal for degree of bladder distention. Other: None. IMPRESSION: 1. Normal renal sonogram. Electronically Signed   By: Kerby Moors M.D.   On: 04/09/2020 09:51   DG Chest Port 1 View  Result Date: 04/09/2020 CLINICAL DATA:  Central line placement. EXAM: PORTABLE CHEST 1 VIEW COMPARISON:  04/04/2020.  03/02/2020. FINDINGS: Tracheostomy tube noted in stable position. Right IJ line noted with tip over cavoatrial junction. Heart size normal. Bilateral interstitial markings again noted without interim change. Findings consistent chronic interstitial lung disease. No acute alveolar infiltrate. No pleural effusion or pneumothorax. Heart size stable. Degenerative change thoracic spine. IMPRESSION: 1. Tracheostomy tube stable position. Right IJ line noted with tip over  cavoatrial junction. No evidence of pneumothorax. 2.  Chronic interstitial lung disease.  No interim change. Electronically Signed   By: Maisie Fus  Register   On: 04/09/2020 13:35    Assessment/Plan Active Problems:   Acute on chronic respiratory failure with hypoxia (HCC)   COVID-19 virus infection   Chronic atrial fibrillation with rapid ventricular response (HCC)   Chronic kidney disease, stage III (moderate)    Healthcare-associated pneumonia   1. Acute on chronic respiratory failure with hypoxia plan is to continue with T collar patient currently on 35% FiO2 and using PMV continue secretion management supportive care. 2. COVID-19 virus infection resolved we will continue with supportive care 3. Chronic atrial fibrillation rate is controlled we will continue to follow 4. Chronic kidney disease stage III at baseline 5. Healthcare associated pneumonia treated clinically is improved we will continue with supportive care   I have personally seen and evaluated the patient, evaluated laboratory and imaging results, formulated the assessment and plan and placed orders. The Patient requires high complexity decision making with multiple systems involvement.  Rounds were done with the Respiratory Therapy Director and Staff therapists and discussed with nursing staff also.  Yevonne Pax, MD [facility] Pulmonary Critical Care Medicine Sleep Medicine

## 2020-04-11 DIAGNOSIS — I482 Chronic atrial fibrillation, unspecified: Secondary | ICD-10-CM | POA: Diagnosis not present

## 2020-04-11 DIAGNOSIS — N183 Chronic kidney disease, stage 3 unspecified: Secondary | ICD-10-CM | POA: Diagnosis not present

## 2020-04-11 DIAGNOSIS — J9621 Acute and chronic respiratory failure with hypoxia: Secondary | ICD-10-CM | POA: Diagnosis not present

## 2020-04-11 DIAGNOSIS — U071 COVID-19: Secondary | ICD-10-CM | POA: Diagnosis not present

## 2020-04-11 LAB — CBC
HCT: 26.4 % — ABNORMAL LOW (ref 39.0–52.0)
Hemoglobin: 8 g/dL — ABNORMAL LOW (ref 13.0–17.0)
MCH: 27.6 pg (ref 26.0–34.0)
MCHC: 30.3 g/dL (ref 30.0–36.0)
MCV: 91 fL (ref 80.0–100.0)
Platelets: 416 10*3/uL — ABNORMAL HIGH (ref 150–400)
RBC: 2.9 MIL/uL — ABNORMAL LOW (ref 4.22–5.81)
RDW: 18.4 % — ABNORMAL HIGH (ref 11.5–15.5)
WBC: 9.1 10*3/uL (ref 4.0–10.5)
nRBC: 0 % (ref 0.0–0.2)

## 2020-04-11 LAB — BASIC METABOLIC PANEL
Anion gap: 14 (ref 5–15)
BUN: 150 mg/dL — ABNORMAL HIGH (ref 8–23)
CO2: 33 mmol/L — ABNORMAL HIGH (ref 22–32)
Calcium: 9.8 mg/dL (ref 8.9–10.3)
Chloride: 86 mmol/L — ABNORMAL LOW (ref 98–111)
Creatinine, Ser: 4.11 mg/dL — ABNORMAL HIGH (ref 0.61–1.24)
GFR calc Af Amer: 15 mL/min — ABNORMAL LOW (ref >60)
GFR calc non Af Amer: 13 mL/min — ABNORMAL LOW (ref >60)
Glucose, Bld: 119 mg/dL — ABNORMAL HIGH (ref 70–99)
Potassium: 4 mmol/L (ref 3.5–5.1)
Sodium: 133 mmol/L — ABNORMAL LOW (ref 135–145)

## 2020-04-11 LAB — MPO/PR-3 (ANCA) ANTIBODIES
ANCA Proteinase 3: <3.5 U/mL (ref 0.0–3.5)
Myeloperoxidase Abs: <9 U/mL (ref 0.0–9.0)

## 2020-04-11 NOTE — Progress Notes (Signed)
Pulmonary Critical Care Medicine [facility] [place]   PULMONARY CRITICAL CARE SERVICE  PROGRESS NOTE  Date of Service: 04/11/2020  Russell Rios  IWL:[id number]  DOB: 23-Aug-1946   DOA: 03/02/2020  Referring Physician: Carron Curie, MD  HPI: Russell Rios is a 74 y.o. male seen for follow up of Acute on Chronic Respiratory Failure.  Patient at this time is off the ventilator on T collar has been on 35% FiO2.  Patient has not had any hemoptysis.  Still remains nonverbal.  Medications: Reviewed on Rounds  Physical Exam:  Vitals: Temperature is 97.0 pulse 70 respiratory rate 24 blood pressure is 110/51 saturations 97%  Ventilator Settings off the ventilator currently on T collar with an FiO2 of 35%  . General: Comfortable at this time . Eyes: Grossly normal lids, irises & conjunctiva . ENT: grossly tongue is normal . Neck: no obvious mass . Cardiovascular: S1 S2 normal no gallop . Respiratory: No rhonchi coarse breath sounds . Abdomen: soft . Skin: no rash seen on limited exam . Musculoskeletal: not rigid . Psychiatric:unable to assess . Neurologic: no seizure no involuntary movements         Lab Data:   Basic Metabolic Panel: Recent Labs  Lab 04/05/20 0623 04/06/20 0537 04/09/20 0833 04/11/20 0559  NA 133* 132* 129* 133*  K 3.3* 3.6 4.0 4.0  CL 87* 81* 81* 86*  CO2 35* 34* 35* 33*  GLUCOSE 126* 133* 119* 119*  BUN 75* 80* 128* 150*  CREATININE 3.11* 3.22* 3.87* 4.11*  CALCIUM 8.8* 9.2 10.1 9.8  PHOS  --   --  3.8  --     ABG: No results for input(s): PHART, PCO2ART, PO2ART, HCO3, O2SAT in the last 168 hours.  Liver Function Tests: Recent Labs  Lab 04/09/20 0833  ALBUMIN 2.1*   No results for input(s): LIPASE, AMYLASE in the last 168 hours. No results for input(s): AMMONIA in the last 168 hours.  CBC: Recent Labs  Lab 04/05/20 0623 04/06/20 0537 04/11/20 0559  WBC 9.2 11.6* 9.1  HGB 7.6* 8.1* 8.0*  HCT 24.9* 27.0* 26.4*  MCV 91.2 90.6  91.0  PLT 420* 427* 416*    Cardiac Enzymes: No results for input(s): CKTOTAL, CKMB, CKMBINDEX, TROPONINI in the last 168 hours.  BNP (last 3 results) No results for input(s): BNP in the last 8760 hours.  ProBNP (last 3 results) No results for input(s): PROBNP in the last 8760 hours.  Radiological Exams: No results found.  Assessment/Plan Active Problems:   Acute on chronic respiratory failure with hypoxia (HCC)   COVID-19 virus infection   Chronic atrial fibrillation with rapid ventricular response (HCC)   Chronic kidney disease, stage III (moderate)   Healthcare-associated pneumonia   1. Acute on chronic respiratory failure with hypoxia plan is to continue with T collar trials patient currently is on 35% FiO2 we will continue to monitor for any hemoptysis. 2. COVID-19 virus infection treated resolved 3. Chronic atrial fibrillation rate controlled 4. Chronic kidney disease stage III supportive care being seen by nephrology 5. Healthcare associated pneumonia treated we will continue to monitor   I have personally seen and evaluated the patient, evaluated laboratory and imaging results, formulated the assessment and plan and placed orders. The Patient requires high complexity decision making with multiple systems involvement.  Rounds were done with the Respiratory Therapy Director and Staff therapists and discussed with nursing staff also.  Yevonne Pax, MD [facility] Pulmonary Critical Care Medicine Sleep Medicine

## 2020-04-12 DIAGNOSIS — N183 Chronic kidney disease, stage 3 unspecified: Secondary | ICD-10-CM | POA: Diagnosis not present

## 2020-04-12 DIAGNOSIS — I482 Chronic atrial fibrillation, unspecified: Secondary | ICD-10-CM | POA: Diagnosis not present

## 2020-04-12 DIAGNOSIS — J9621 Acute and chronic respiratory failure with hypoxia: Secondary | ICD-10-CM | POA: Diagnosis not present

## 2020-04-12 DIAGNOSIS — U071 COVID-19: Secondary | ICD-10-CM | POA: Diagnosis not present

## 2020-04-12 LAB — PROTEIN ELECTROPHORESIS, SERUM
A/G Ratio: 0.5 — ABNORMAL LOW (ref 0.7–1.7)
Albumin ELP: 2.3 g/dL — ABNORMAL LOW (ref 2.9–4.4)
Alpha-1-Globulin: 0.6 g/dL — ABNORMAL HIGH (ref 0.0–0.4)
Alpha-2-Globulin: 1 g/dL (ref 0.4–1.0)
Beta Globulin: 1.1 g/dL (ref 0.7–1.3)
Gamma Globulin: 1.7 g/dL (ref 0.4–1.8)
Globulin, Total: 4.4 g/dL — ABNORMAL HIGH (ref 2.2–3.9)
M-Spike, %: 0.4 g/dL — ABNORMAL HIGH
Total Protein ELP: 6.7 g/dL (ref 6.0–8.5)

## 2020-04-12 LAB — GLOMERULAR BASEMENT MEMBRANE ANTIBODIES: GBM Ab: 9 units (ref 0–20)

## 2020-04-12 NOTE — Progress Notes (Signed)
Pulmonary Critical Care Medicine [facility] [place]   PULMONARY CRITICAL CARE SERVICE  PROGRESS NOTE  Date of Service: 04/12/2020  [name]  ZOX:[id number]  DOB: May 16, 1946   DOA: 03/02/2020  Referring Physician: Carron Curie, MD  HPI: Russell Rios is a 74 y.o. male seen for follow up of Acute on Chronic Respiratory Failure.  Patient currently comfortable without distress for now is on 35% FiO2 using PMV  Medications: Reviewed on Rounds  Physical Exam:  Vitals: Temperature is 97.3 pulse 69 respiratory rate 22 blood pressure is 112/55 saturations 100%  Ventilator Settings on T collar with an FiO2 of 35% with PMV  . General: Comfortable at this time . Eyes: Grossly normal lids, irises & conjunctiva . ENT: grossly tongue is normal . Neck: no obvious mass . Cardiovascular: S1 S2 normal no gallop . Respiratory: No rhonchi no rales are noted at this time . Abdomen: soft . Skin: no rash seen on limited exam . Musculoskeletal: not rigid . Psychiatric:unable to assess . Neurologic: no seizure no involuntary movements         Lab Data:   Basic Metabolic Panel: Recent Labs  Lab 04/06/20 0537 04/09/20 0833 04/11/20 0559  NA 132* 129* 133*  K 3.6 4.0 4.0  CL 81* 81* 86*  CO2 34* 35* 33*  GLUCOSE 133* 119* 119*  BUN 80* 128* 150*  CREATININE 3.22* 3.87* 4.11*  CALCIUM 9.2 10.1 9.8  PHOS  --  3.8  --     ABG: No results for input(s): PHART, PCO2ART, PO2ART, HCO3, O2SAT in the last 168 hours.  Liver Function Tests: Recent Labs  Lab 04/09/20 0833  ALBUMIN 2.1*   No results for input(s): LIPASE, AMYLASE in the last 168 hours. No results for input(s): AMMONIA in the last 168 hours.  CBC: Recent Labs  Lab 04/06/20 0537 04/11/20 0559  WBC 11.6* 9.1  HGB 8.1* 8.0*  HCT 27.0* 26.4*  MCV 90.6 91.0  PLT 427* 416*    Cardiac Enzymes: No results for input(s): CKTOTAL, CKMB, CKMBINDEX, TROPONINI in the last 168 hours.  BNP (last 3 results) No  results for input(s): BNP in the last 8760 hours.  ProBNP (last 3 results) No results for input(s): PROBNP in the last 8760 hours.  Radiological Exams: No results found.  Assessment/Plan Active Problems:   Acute on chronic respiratory failure with hypoxia (HCC)   COVID-19 virus infection   Chronic atrial fibrillation with rapid ventricular response (HCC)   Chronic kidney disease, stage III (moderate)   Healthcare-associated pneumonia   1. Acute on chronic respiratory failure hypoxia we will continue with T collar trials titrate oxygen continue pulmonary toilet.  Spoke with respiratory therapy today to begin capping trials 2. COVID-19 virus infection treated resolved 3. Chronic atrial fibrillation rate controlled 4. Chronic kidney disease stage III supportive care being seen by nephrology 5. Healthcare associated pneumonia treated   I have personally seen and evaluated the patient, evaluated laboratory and imaging results, formulated the assessment and plan and placed orders. The Patient requires high complexity decision making with multiple systems involvement.  Rounds were done with the Respiratory Therapy Director and Staff therapists and discussed with nursing staff also.  Yevonne Pax, MD [facility] Pulmonary Critical Care Medicine Sleep Medicine

## 2020-04-12 NOTE — Progress Notes (Signed)
[facility]  ROUNDING NOTE   Subjective:  Renal parameters worse on labs from yesterday. BUN up to 158 with a creatinine of 4.11. Serum sodium up slightly to 133. Urine output good at 1.8 L.  Objective:  Vital signs in last 24 hours:  Temperature 97.3 pulse 69 respirations 22 blood pressure 112/55   Physical Exam: General: No acute distress  Head: Normocephalic, atraumatic. 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  Neurologic: Awake, alert, following commands  Skin: No rash       Basic Metabolic Panel: Recent Labs  Lab 04/06/20 0537 04/09/20 0833 04/11/20 0559  NA 132* 129* 133*  K 3.6 4.0 4.0  CL 81* 81* 86*  CO2 34* 35* 33*  GLUCOSE 133* 119* 119*  BUN 80* 128* 150*  CREATININE 3.22* 3.87* 4.11*  CALCIUM 9.2 10.1 9.8  PHOS  --  3.8  --     Liver Function Tests: Recent Labs  Lab 04/09/20 0833  ALBUMIN 2.1*   No results for input(s): LIPASE, AMYLASE in the last 168 hours. No results for input(s): AMMONIA in the last 168 hours.  CBC: Recent Labs  Lab 04/06/20 0537 04/11/20 0559  WBC 11.6* 9.1  HGB 8.1* 8.0*  HCT 27.0* 26.4*  MCV 90.6 91.0  PLT 427* 416*    Cardiac Enzymes: No results for input(s): CKTOTAL, CKMB, CKMBINDEX, TROPONINI in the last 168 hours.  BNP: Invalid input(s): POCBNP  CBG: No results for input(s): GLUCAP in the last 168 hours.  Microbiology: Results for orders placed or performed during the hospital encounter of 03/02/20  Culture, respiratory     Status: None   Collection Time: 03/19/20  1:35 PM   Specimen: Tracheal Aspirate  Result Value Ref Range Status   Specimen Description TRACHEAL ASPIRATE  Final   Special Requests NONE  Final   Gram Stain   Final    NO WBC SEEN FEW SQUAMOUS EPITHELIAL CELLS PRESENT NO ORGANISMS  SEEN    Culture   Final    ABUNDANT PSEUDOMONAS AERUGINOSA SEE SEPARATE REPORT FOR ADDITIONAL SUSCEPTIBILITIES Performed at [employer] Performed at [facility], [place] [address], [place], [place] [address]    Report Status 04/10/2020 FINAL  Final   Organism ID, Bacteria PSEUDOMONAS AERUGINOSA  Final      Susceptibility   Pseudomonas aeruginosa - MIC*    CEFTAZIDIME >=64 RESISTANT Resistant     CIPROFLOXACIN >=4 RESISTANT Resistant     GENTAMICIN 4 SENSITIVE Sensitive     IMIPENEM >=16 RESISTANT Resistant     CEFEPIME >=64 RESISTANT Resistant     * ABUNDANT PSEUDOMONAS AERUGINOSA  Culture, respiratory (non-expectorated)     Status: None   Collection Time: 03/20/20 10:17 AM   Specimen: Tracheal Aspirate; Respiratory  Result Value Ref Range Status   Specimen Description TRACHEAL ASPIRATE  Final   Special Requests NONE  Final   Gram Stain   Final    NO WBC SEEN RARE GRAM POSITIVE COCCI IN PAIRS FEW GRAM VARIABLE ROD Performed at [facility], [place] [address] [place], [place] [address]    Culture ABUNDANT PSEUDOMONAS AERUGINOSA  Final   Report Status 03/22/2020 FINAL  Final   Organism ID, Bacteria PSEUDOMONAS AERUGINOSA  Final      Susceptibility   Pseudomonas aeruginosa - MIC*  CEFTAZIDIME >=64 RESISTANT Resistant     CIPROFLOXACIN 2 INTERMEDIATE Intermediate     GENTAMICIN 8 INTERMEDIATE Intermediate     IMIPENEM >=16 RESISTANT Resistant     CEFEPIME >=64 RESISTANT Resistant     * ABUNDANT PSEUDOMONAS AERUGINOSA    Coagulation Studies: No results for input(s): LABPROT, INR in the last 72 hours.  Urinalysis: No results for input(s): COLORURINE, LABSPEC, PHURINE, GLUCOSEU, HGBUR, BILIRUBINUR, KETONESUR, PROTEINUR, UROBILINOGEN, NITRITE, LEUKOCYTESUR in the last 72 hours.  Invalid input(s): APPERANCEUR    Imaging: No results found.   Medications:     iohexol  Assessment/ Plan:  74 y.o. male with a PMHx of abdominal aortic aneurysm, right  hepatic lobe lesion, cystic mass left kidney, BPH, history of tobacco abuse, hypertension, hyperlipidemia, COVID-19 infection,, acute respiratory failure secondary to COVID-19 infection and Pseudomonas who was admitted to [facility] on 03/02/2020 for ongoing management.  1.  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  Suspect that the patient has developed acute tubular necrosis.  He was also exposed to contrast on 03/24/2020.  -Renal ultrasound was found to be normal.  Despite IV fluid hydration renal function actually worse.  Serologic work-up also unremarkable.  Urine output was 1.8 L over the patient in 24 hours.  Therefore no urgent indication for dialysis at the moment however if azotemia continues to worsen we may need to consider this.  2.  Anemia of chronic kidney disease.  Hemoglobin 8.0.  No indication for transfusion at the moment.  3.  Hyponatremia.  Serum sodium improved to 133.  Continue to monitor serum sodium.   LOS: 0 Russell Rios 5/24/20218:27 AM

## 2020-04-13 ENCOUNTER — Other Ambulatory Visit (HOSPITAL_COMMUNITY): Payer: Medicare Other

## 2020-04-13 DIAGNOSIS — I482 Chronic atrial fibrillation, unspecified: Secondary | ICD-10-CM | POA: Diagnosis not present

## 2020-04-13 DIAGNOSIS — N183 Chronic kidney disease, stage 3 unspecified: Secondary | ICD-10-CM | POA: Diagnosis not present

## 2020-04-13 DIAGNOSIS — J9621 Acute and chronic respiratory failure with hypoxia: Secondary | ICD-10-CM | POA: Diagnosis not present

## 2020-04-13 DIAGNOSIS — U071 COVID-19: Secondary | ICD-10-CM | POA: Diagnosis not present

## 2020-04-13 LAB — BASIC METABOLIC PANEL
Anion gap: 13 (ref 5–15)
BUN: 154 mg/dL — ABNORMAL HIGH (ref 8–23)
CO2: 38 mmol/L — ABNORMAL HIGH (ref 22–32)
Calcium: 10.8 mg/dL — ABNORMAL HIGH (ref 8.9–10.3)
Chloride: 87 mmol/L — ABNORMAL LOW (ref 98–111)
Creatinine, Ser: 4.07 mg/dL — ABNORMAL HIGH (ref 0.61–1.24)
GFR calc Af Amer: 16 mL/min — ABNORMAL LOW (ref >60)
GFR calc non Af Amer: 14 mL/min — ABNORMAL LOW (ref >60)
Glucose, Bld: 127 mg/dL — ABNORMAL HIGH (ref 70–99)
Potassium: 4.1 mmol/L (ref 3.5–5.1)
Sodium: 138 mmol/L (ref 135–145)

## 2020-04-13 LAB — CBC
HCT: 28.2 % — ABNORMAL LOW (ref 39.0–52.0)
Hemoglobin: 8.4 g/dL — ABNORMAL LOW (ref 13.0–17.0)
MCH: 27.6 pg (ref 26.0–34.0)
MCHC: 29.8 g/dL — ABNORMAL LOW (ref 30.0–36.0)
MCV: 92.8 fL (ref 80.0–100.0)
Platelets: 419 10*3/uL — ABNORMAL HIGH (ref 150–400)
RBC: 3.04 MIL/uL — ABNORMAL LOW (ref 4.22–5.81)
RDW: 18.5 % — ABNORMAL HIGH (ref 11.5–15.5)
WBC: 10.4 10*3/uL (ref 4.0–10.5)
nRBC: 0 % (ref 0.0–0.2)

## 2020-04-13 LAB — URIC ACID: Uric Acid, Serum: 8.6 mg/dL (ref 3.7–8.6)

## 2020-04-13 NOTE — Progress Notes (Signed)
Pulmonary Critical Care Medicine [facility] [place]   PULMONARY CRITICAL CARE SERVICE  PROGRESS NOTE  Date of Service: 04/13/2020  Russell Rios  GTX:[id number]  DOB: 12/20/1945   DOA: 03/02/2020  Referring Physician: Carron Curie, MD  HPI: Russell Rios is a 74 y.o. male seen for follow up of Acute on Chronic Respiratory Failure.  Currently capping has been on 4 L today will be 24 hours  Medications: Reviewed on Rounds  Physical Exam:  Vitals: Temperature is 96.8 pulse 84 respiratory rate 28 blood pressure is 139/70 saturations 94%  Ventilator Settings capping on 4 L oxygen 24 hours  . General: Comfortable at this time . Eyes: Grossly normal lids, irises & conjunctiva . ENT: grossly tongue is normal . Neck: no obvious mass . Cardiovascular: S1 S2 normal no gallop . Respiratory: Scattered rhonchi expansion is equal . Abdomen: soft . Skin: no rash seen on limited exam . Musculoskeletal: not rigid . Psychiatric:unable to assess . Neurologic: no seizure no involuntary movements         Lab Data:   Basic Metabolic Panel: Recent Labs  Lab 04/09/20 0833 04/11/20 0559 04/13/20 0837  NA 129* 133* 138  K 4.0 4.0 4.1  CL 81* 86* 87*  CO2 35* 33* 38*  GLUCOSE 119* 119* 127*  BUN 128* 150* 154*  CREATININE 3.87* 4.11* 4.07*  CALCIUM 10.1 9.8 10.8*  PHOS 3.8  --   --     ABG: No results for input(s): PHART, PCO2ART, PO2ART, HCO3, O2SAT in the last 168 hours.  Liver Function Tests: Recent Labs  Lab 04/09/20 0833  ALBUMIN 2.1*   No results for input(s): LIPASE, AMYLASE in the last 168 hours. No results for input(s): AMMONIA in the last 168 hours.  CBC: Recent Labs  Lab 04/11/20 0559 04/13/20 0837  WBC 9.1 10.4  HGB 8.0* 8.4*  HCT 26.4* 28.2*  MCV 91.0 92.8  PLT 416* 419*    Cardiac Enzymes: No results for input(s): CKTOTAL, CKMB, CKMBINDEX, TROPONINI in the last 168 hours.  BNP (last 3 results) No results for input(s): BNP in the last 8760  hours.  ProBNP (last 3 results) No results for input(s): PROBNP in the last 8760 hours.  Radiological Exams: No results found.  Assessment/Plan Active Problems:   Acute on chronic respiratory failure with hypoxia (HCC)   COVID-19 virus infection   Chronic atrial fibrillation with rapid ventricular response (HCC)   Chronic kidney disease, stage III (moderate)   Healthcare-associated pneumonia   1. Acute on chronic respiratory failure hypoxia progressing nicely with the weaning now is on capping trials 4 L goal is 24 hours 2. COVID-19 virus infection resolved we will continue with supportive care 3. Chronic atrial fibrillation rate is controlled 4. Chronic kidney disease stage III we will continue to follow 5. Healthcare associated pneumonia treated resolved we will continue with supportive care   I have personally seen and evaluated the patient, evaluated laboratory and imaging results, formulated the assessment and plan and placed orders. The Patient requires high complexity decision making with multiple systems involvement.  Rounds were done with the Respiratory Therapy Director and Staff therapists and discussed with nursing staff also.  Yevonne Pax, MD [facility] Pulmonary Critical Care Medicine Sleep Medicine

## 2020-04-14 DIAGNOSIS — I482 Chronic atrial fibrillation, unspecified: Secondary | ICD-10-CM | POA: Diagnosis not present

## 2020-04-14 DIAGNOSIS — U071 COVID-19: Secondary | ICD-10-CM | POA: Diagnosis not present

## 2020-04-14 DIAGNOSIS — J9621 Acute and chronic respiratory failure with hypoxia: Secondary | ICD-10-CM | POA: Diagnosis not present

## 2020-04-14 DIAGNOSIS — N183 Chronic kidney disease, stage 3 unspecified: Secondary | ICD-10-CM | POA: Diagnosis not present

## 2020-04-14 NOTE — Progress Notes (Signed)
[facility]  ROUNDING NOTE   Subjective:  BUN continues to rise slowly and is currently 154. Creatinine 4.07. Discussed with nursing.  Advised them to increase free water flush to 100 cc every 2 hours.  Objective:  Vital signs in last 24 hours:  Temperature 96.6 pulse 79 respirations 18 blood pressure 129/64   Physical Exam: General: No acute distress  Head: Normocephalic, atraumatic. 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  Neurologic: Awake, alert, following commands  Skin: No rash       Basic Metabolic Panel: Recent Labs  Lab 04/09/20 0833 04/11/20 0559 04/13/20 0837  NA 129* 133* 138  K 4.0 4.0 4.1  CL 81* 86* 87*  CO2 35* 33* 38*  GLUCOSE 119* 119* 127*  BUN 128* 150* 154*  CREATININE 3.87* 4.11* 4.07*  CALCIUM 10.1 9.8 10.8*  PHOS 3.8  --   --     Liver Function Tests: Recent Labs  Lab 04/09/20 0833  ALBUMIN 2.1*   No results for input(s): LIPASE, AMYLASE in the last 168 hours. No results for input(s): AMMONIA in the last 168 hours.  CBC: Recent Labs  Lab 04/11/20 0559 04/13/20 0837  WBC 9.1 10.4  HGB 8.0* 8.4*  HCT 26.4* 28.2*  MCV 91.0 92.8  PLT 416* 419*    Cardiac Enzymes: No results for input(s): CKTOTAL, CKMB, CKMBINDEX, TROPONINI in the last 168 hours.  BNP: Invalid input(s): POCBNP  CBG: No results for input(s): GLUCAP in the last 168 hours.  Microbiology: Results for orders placed or performed during the hospital encounter of 03/02/20  Culture, respiratory     Status: None   Collection Time: 03/19/20  1:35 PM   Specimen: Tracheal Aspirate  Result Value Ref Range Status   Specimen Description TRACHEAL ASPIRATE  Final   Special Requests NONE  Final   Gram Stain   Final    NO WBC SEEN FEW SQUAMOUS EPITHELIAL CELLS PRESENT NO  ORGANISMS SEEN    Culture   Final    ABUNDANT PSEUDOMONAS AERUGINOSA SEE SEPARATE REPORT FOR ADDITIONAL SUSCEPTIBILITIES Performed at [employer] Performed at [facility], [address], [place], [place] [address]    Report Status 04/10/2020 FINAL  Final   Organism ID, Bacteria PSEUDOMONAS AERUGINOSA  Final      Susceptibility   Pseudomonas aeruginosa - MIC*    CEFTAZIDIME >=64 RESISTANT Resistant     CIPROFLOXACIN >=4 RESISTANT Resistant     GENTAMICIN 4 SENSITIVE Sensitive     IMIPENEM >=16 RESISTANT Resistant     CEFEPIME >=64 RESISTANT Resistant     * ABUNDANT PSEUDOMONAS AERUGINOSA  Culture, respiratory (non-expectorated)     Status: None   Collection Time: 03/20/20 10:17 AM   Specimen: Tracheal Aspirate; Respiratory  Result Value Ref Range Status   Specimen Description TRACHEAL ASPIRATE  Final   Special Requests NONE  Final   Gram Stain   Final    NO WBC SEEN RARE GRAM POSITIVE COCCI IN PAIRS FEW GRAM VARIABLE ROD Performed at [facility], [address], [place], [place] [address]    Culture ABUNDANT PSEUDOMONAS AERUGINOSA  Final   Report Status 03/22/2020 FINAL  Final   Organism ID, Bacteria PSEUDOMONAS AERUGINOSA  Final      Susceptibility   Pseudomonas aeruginosa - MIC*  CEFTAZIDIME >=64 RESISTANT Resistant     CIPROFLOXACIN 2 INTERMEDIATE Intermediate     GENTAMICIN 8 INTERMEDIATE Intermediate     IMIPENEM >=16 RESISTANT Resistant     CEFEPIME >=64 RESISTANT Resistant     * ABUNDANT PSEUDOMONAS AERUGINOSA    Coagulation Studies: No results for input(s): LABPROT, INR in the last 72 hours.  Urinalysis: No results for input(s): COLORURINE, LABSPEC, PHURINE, GLUCOSEU, HGBUR, BILIRUBINUR, KETONESUR, PROTEINUR, UROBILINOGEN, NITRITE, LEUKOCYTESUR in the last 72 hours.  Invalid input(s): APPERANCEUR    Imaging: DG Foot Complete Right  Result Date: 04/13/2020 CLINICAL DATA:  Right foot pain. Fracture. EXAM: RIGHT FOOT COMPLETE - 3+  VIEW COMPARISON:  None. FINDINGS: Overlying monitoring device on the third toe limits assessment. Hammertoe deformity of the second digit. Alignment is otherwise normal. No acute fracture. Degenerative change of the first metatarsal phalangeal joint and interphalangeal joint. Mild midfoot degenerative spurring. There is a plantar calcaneal spur. No bony destruction or periosteal reaction. Generalized soft tissue edema overlies the dorsum of the foot. There is a 3 mm density projecting over the plantar aspect of the great toe distal phalanx in the soft tissues at may be a foreign body, seen only on the lateral view. IMPRESSION: 1. Possible 3 mm foreign body projecting over the plantar aspect of the great toe distal phalanx, seen only on the lateral view. 2. Soft tissue edema over the dorsum of the foot. 3. No acute fracture. 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 4. Plantar calcaneal spur. Electronically Signed   By: Keith Rake M.D.   On: 04/13/2020 19:00     Medications:     iohexol  Assessment/ Plan:  74 y.o. male with a PMHx of abdominal aortic aneurysm, right hepatic lobe lesion, cystic mass left kidney, BPH, history of tobacco abuse, hypertension, hyperlipidemia, COVID-19 infection,, acute respiratory failure secondary to COVID-19 infection and Pseudomonas who was admitted to [facility] on 03/02/2020 for ongoing management.  1.  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  Suspect that the patient has developed acute tubular necrosis.  He was also exposed to contrast on 03/24/2020.  -On the whole serologic work-up found to be unremarkable.  Renal ultrasound also unremarkable.  Continues to have rising BUN and relatively stable creatinine.  Suspect azotemia may in part be secondary to concentrated tube feeds.  We will increase free water flush to 100 cc every 2 hours.  Patient continues to have adequate urine output therefore we will hold off on  dialysis treatments.  2.  Anemia of chronic kidney disease.  Hemoglobin currently 8.4.  No indication for blood transfusion.  3.  Hyponatremia.  Improved.  Serum sodium up to 138.   LOS: 0 Tyeasha Ebbs 5/26/20218:55 AM

## 2020-04-14 NOTE — Progress Notes (Signed)
Pulmonary Critical Care Medicine [place]   PULMONARY CRITICAL CARE SERVICE  PROGRESS NOTE  Date of Service: 04/14/2020  Russell Rios  YKD:[id number]  DOB: October 28, 1946   DOA: 03/02/2020  Referring Physician: Merton Border, MD  HPI: Russell Rios is a 74 y.o. male seen for follow up of Acute on Chronic Respiratory Failure.  Patient is doing well right now has been on capping trials on 2 L of oxygen today will be completing 48 hours  Medications: Reviewed on Rounds  Physical Exam:  Vitals: Temperature is 96.6 pulse 79 respiratory 18 blood pressure is 129/64 saturations 97%  Ventilator Settings capping on 2 L oxygen  . General: Comfortable at this time . Eyes: Grossly normal lids, irises & conjunctiva . ENT: grossly tongue is normal . Neck: no obvious mass . Cardiovascular: S1 S2 normal no gallop . Respiratory: No rhonchi coarse breath sounds are noted . Abdomen: soft . Skin: no rash seen on limited exam . Musculoskeletal: not rigid . Psychiatric:unable to assess . Neurologic: no seizure no involuntary movements         Lab Data:   Basic Metabolic Panel: Recent Labs  Lab 04/09/20 0833 04/11/20 0559 04/13/20 0837  NA 129* 133* 138  K 4.0 4.0 4.1  CL 81* 86* 87*  CO2 35* 33* 38*  GLUCOSE 119* 119* 127*  BUN 128* 150* 154*  CREATININE 3.87* 4.11* 4.07*  CALCIUM 10.1 9.8 10.8*  PHOS 3.8  --   --     ABG: No results for input(s): PHART, PCO2ART, PO2ART, HCO3, O2SAT in the last 168 hours.  Liver Function Tests: Recent Labs  Lab 04/09/20 0833  ALBUMIN 2.1*   No results for input(s): LIPASE, AMYLASE in the last 168 hours. No results for input(s): AMMONIA in the last 168 hours.  CBC: Recent Labs  Lab 04/11/20 0559 04/13/20 0837  WBC 9.1 10.4  HGB 8.0* 8.4*  HCT 26.4* 28.2*  MCV 91.0 92.8  PLT 416* 419*    Cardiac Enzymes: No results for input(s): CKTOTAL, CKMB, CKMBINDEX, TROPONINI in the last 168 hours.  BNP (last 3 results) No  results for input(s): BNP in the last 8760 hours.  ProBNP (last 3 results) No results for input(s): PROBNP in the last 8760 hours.  Radiological Exams: DG Foot Complete Right  Result Date: 04/13/2020 CLINICAL DATA:  Right foot pain. Fracture. EXAM: RIGHT FOOT COMPLETE - 3+ VIEW COMPARISON:  None. FINDINGS: Overlying monitoring device on the third toe limits assessment. Hammertoe deformity of the second digit. Alignment is otherwise normal. No acute fracture. Degenerative change of the first metatarsal phalangeal joint and interphalangeal joint. Mild midfoot degenerative spurring. There is a plantar calcaneal spur. No bony destruction or periosteal reaction. Generalized soft tissue edema overlies the dorsum of the foot. There is a 3 mm density projecting over the plantar aspect of the great toe distal phalanx in the soft tissues at may be a foreign body, seen only on the lateral view. IMPRESSION: 1. Possible 3 mm foreign body projecting over the plantar aspect of the great toe distal phalanx, seen only on the lateral view. 2. Soft tissue edema over the dorsum of the foot. 3. No acute fracture. 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 4. Plantar calcaneal spur. Electronically Signed   By: Keith Rake M.D.   On: 04/13/2020 19:00    Assessment/Plan Active Problems:   Acute on chronic respiratory failure with hypoxia ([place])   COVID-19 virus infection   Chronic atrial fibrillation with rapid ventricular  response (HCC)   Chronic kidney disease, stage III (moderate)   Healthcare-associated pneumonia   1. Acute on chronic respiratory failure hypoxia we will continue with capping trials patient is on 2 L oxygen goal is 48 hours 2. COVID-19 virus infection treated resolved we will continue with supportive care 3. Chronic atrial fibrillation rate is controlled 4. Chronic kidney disease stage III we will continue with nephrology recommendations 5. Healthcare associated  pneumonia treated clinically improved   I have personally seen and evaluated the patient, evaluated laboratory and imaging results, formulated the assessment and plan and placed orders. The Patient requires high complexity decision making with multiple systems involvement.  Rounds were done with the Respiratory Therapy Director and Staff therapists and discussed with nursing staff also.  Yevonne Pax, MD [facility] Pulmonary Critical Care Medicine Sleep Medicine

## 2020-04-15 DIAGNOSIS — N183 Chronic kidney disease, stage 3 unspecified: Secondary | ICD-10-CM | POA: Diagnosis not present

## 2020-04-15 DIAGNOSIS — U071 COVID-19: Secondary | ICD-10-CM | POA: Diagnosis not present

## 2020-04-15 DIAGNOSIS — J9621 Acute and chronic respiratory failure with hypoxia: Secondary | ICD-10-CM | POA: Diagnosis not present

## 2020-04-15 DIAGNOSIS — I482 Chronic atrial fibrillation, unspecified: Secondary | ICD-10-CM | POA: Diagnosis not present

## 2020-04-15 LAB — BASIC METABOLIC PANEL
Anion gap: 11 (ref 5–15)
BUN: 154 mg/dL — ABNORMAL HIGH (ref 8–23)
CO2: 39 mmol/L — ABNORMAL HIGH (ref 22–32)
Calcium: 10.5 mg/dL — ABNORMAL HIGH (ref 8.9–10.3)
Chloride: 92 mmol/L — ABNORMAL LOW (ref 98–111)
Creatinine, Ser: 3.5 mg/dL — ABNORMAL HIGH (ref 0.61–1.24)
GFR calc Af Amer: 19 mL/min — ABNORMAL LOW (ref >60)
GFR calc non Af Amer: 16 mL/min — ABNORMAL LOW (ref >60)
Glucose, Bld: 131 mg/dL — ABNORMAL HIGH (ref 70–99)
Potassium: 4.1 mmol/L (ref 3.5–5.1)
Sodium: 142 mmol/L (ref 135–145)

## 2020-04-15 NOTE — Progress Notes (Signed)
Pulmonary Critical Care Medicine [place]   PULMONARY CRITICAL CARE SERVICE  PROGRESS NOTE  Date of Service: 04/15/2020  Russell Rios  UYQ:[id number]  DOB: 1946-05-26   DOA: 03/02/2020  Referring Physician: Merton Border, MD  HPI: Russell Rios is a 74 y.o. male seen for follow up of Acute on Chronic Respiratory Failure.  Patient is doing well with capping trials has been capping now for 72 hours and is requiring only 2 L of O2.  Patient has a good strong cough  Medications: Reviewed on Rounds  Physical Exam:  Vitals: Temperature 97.0 pulse 79 respiratory 22 blood pressure is 139/71 saturations 96%  Ventilator Settings capping off the ventilator  . General: Comfortable at this time . Eyes: Grossly normal lids, irises & conjunctiva . ENT: grossly tongue is normal . Neck: no obvious mass . Cardiovascular: S1 S2 normal no gallop . Respiratory: No rhonchi no rales are noted at this time . Abdomen: soft . Skin: no rash seen on limited exam . Musculoskeletal: not rigid . Psychiatric:unable to assess . Neurologic: no seizure no involuntary movements         Lab Data:   Basic Metabolic Panel: Recent Labs  Lab 04/09/20 0833 04/11/20 0559 04/13/20 0837  NA 129* 133* 138  K 4.0 4.0 4.1  CL 81* 86* 87*  CO2 35* 33* 38*  GLUCOSE 119* 119* 127*  BUN 128* 150* 154*  CREATININE 3.87* 4.11* 4.07*  CALCIUM 10.1 9.8 10.8*  PHOS 3.8  --   --     ABG: No results for input(s): PHART, PCO2ART, PO2ART, HCO3, O2SAT in the last 168 hours.  Liver Function Tests: Recent Labs  Lab 04/09/20 0833  ALBUMIN 2.1*   No results for input(s): LIPASE, AMYLASE in the last 168 hours. No results for input(s): AMMONIA in the last 168 hours.  CBC: Recent Labs  Lab 04/11/20 0559 04/13/20 0837  WBC 9.1 10.4  HGB 8.0* 8.4*  HCT 26.4* 28.2*  MCV 91.0 92.8  PLT 416* 419*    Cardiac Enzymes: No results for input(s): CKTOTAL, CKMB, CKMBINDEX, TROPONINI in the last 168  hours.  BNP (last 3 results) No results for input(s): BNP in the last 8760 hours.  ProBNP (last 3 results) No results for input(s): PROBNP in the last 8760 hours.  Radiological Exams: DG Foot Complete Right  Result Date: 04/13/2020 CLINICAL DATA:  Right foot pain. Fracture. EXAM: RIGHT FOOT COMPLETE - 3+ VIEW COMPARISON:  None. FINDINGS: Overlying monitoring device on the third toe limits assessment. Hammertoe deformity of the second digit. Alignment is otherwise normal. No acute fracture. Degenerative change of the first metatarsal phalangeal joint and interphalangeal joint. Mild midfoot degenerative spurring. There is a plantar calcaneal spur. No bony destruction or periosteal reaction. Generalized soft tissue edema overlies the dorsum of the foot. There is a 3 mm density projecting over the plantar aspect of the great toe distal phalanx in the soft tissues at may be a foreign body, seen only on the lateral view. IMPRESSION: 1. Possible 3 mm foreign body projecting over the plantar aspect of the great toe distal phalanx, seen only on the lateral view. 2. Soft tissue edema over the dorsum of the foot. 3. No acute fracture. 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 4. Plantar calcaneal spur. Electronically Signed   By: Keith Rake M.D.   On: 04/13/2020 19:00    Assessment/Plan Active Problems:   Acute on chronic respiratory failure with hypoxia (HCC)   COVID-19 virus infection  Chronic atrial fibrillation with rapid ventricular response (HCC)   Chronic kidney disease, stage III (moderate)   Healthcare-associated pneumonia   1. Acute on chronic respiratory failure hypoxia plan is to proceed to decannulation patient has done well with 72 hours of capping 2. COVID-19 virus infection resolved continue with supportive care 3. Chronic atrial fibrillation rate is controlled 4. Chronic kidney disease stage III supportive care 5. Healthcare associated pneumonia  clinically resolved   I have personally seen and evaluated the patient, evaluated laboratory and imaging results, formulated the assessment and plan and placed orders. The Patient requires high complexity decision making with multiple systems involvement.  Rounds were done with the Respiratory Therapy Director and Staff therapists and discussed with nursing staff also.  Yevonne Pax, MD [facility] Pulmonary Critical Care Medicine Sleep Medicine

## 2020-04-16 DIAGNOSIS — I482 Chronic atrial fibrillation, unspecified: Secondary | ICD-10-CM | POA: Diagnosis not present

## 2020-04-16 DIAGNOSIS — N183 Chronic kidney disease, stage 3 unspecified: Secondary | ICD-10-CM | POA: Diagnosis not present

## 2020-04-16 DIAGNOSIS — U071 COVID-19: Secondary | ICD-10-CM | POA: Diagnosis not present

## 2020-04-16 DIAGNOSIS — J9621 Acute and chronic respiratory failure with hypoxia: Secondary | ICD-10-CM | POA: Diagnosis not present

## 2020-04-16 NOTE — Progress Notes (Addendum)
Pulmonary Critical Care Medicine [facility] [place]   PULMONARY CRITICAL CARE SERVICE  PROGRESS NOTE  Date of Service: 04/16/2020  Takeru Bose  UXN:[id number]  DOB: [birth year]   DOA: 03/02/2020  Referring Physician: Carron Curie, MD  HPI: Shaylon Aden is a 74 y.o. male seen for follow up of Acute on Chronic Respiratory Failure.  Patient was decannulated currently on room air satting well no fever distress.  Medications: Reviewed on Rounds  Physical Exam:  Vitals: Pulse 73 respirations 16 BP 108/53 O2 sat 97% temp 96.9  Ventilator Settings room air  . General: Comfortable at this time . Eyes: Grossly normal lids, irises & conjunctiva . ENT: grossly tongue is normal . Neck: no obvious mass . Cardiovascular: S1 S2 normal no gallop . Respiratory: No rales or rhonchi noted . Abdomen: soft . Skin: no rash seen on limited exam . Musculoskeletal: not rigid . Psychiatric:unable to assess . Neurologic: no seizure no involuntary movements         Lab Data:   Basic Metabolic Panel: Recent Labs  Lab 04/11/20 0559 04/13/20 0837 04/15/20 1557  NA 133* 138 142  K 4.0 4.1 4.1  CL 86* 87* 92*  CO2 33* 38* 39*  GLUCOSE 119* 127* 131*  BUN 150* 154* 154*  CREATININE 4.11* 4.07* 3.50*  CALCIUM 9.8 10.8* 10.5*    ABG: No results for input(s): PHART, PCO2ART, PO2ART, HCO3, O2SAT in the last 168 hours.  Liver Function Tests: No results for input(s): AST, ALT, ALKPHOS, BILITOT, PROT, ALBUMIN in the last 168 hours. No results for input(s): LIPASE, AMYLASE in the last 168 hours. No results for input(s): AMMONIA in the last 168 hours.  CBC: Recent Labs  Lab 04/11/20 0559 04/13/20 0837  WBC 9.1 10.4  HGB 8.0* 8.4*  HCT 26.4* 28.2*  MCV 91.0 92.8  PLT 416* 419*    Cardiac Enzymes: No results for input(s): CKTOTAL, CKMB, CKMBINDEX, TROPONINI in the last 168 hours.  BNP (last 3 results) No results for input(s): BNP in the last 8760 hours.  ProBNP (last 3  results) No results for input(s): PROBNP in the last 8760 hours.  Radiological Exams: No results found.  Assessment/Plan Active Problems:   Acute on chronic respiratory failure with hypoxia (HCC)   COVID-19 virus infection   Chronic atrial fibrillation with rapid ventricular response (HCC)   Chronic kidney disease, stage III (moderate)   Healthcare-associated pneumonia   1. Acute on chronic respiratory failure hypoxia patient is doing well with decannulation currently on room air.  We will continue supportive measures and pulmonary toilet. 2. COVID-19 virus infection resolved continue with supportive care 3. Chronic atrial fibrillation rate is controlled 4. Chronic kidney disease stage III supportive care 5. Healthcare associated pneumonia clinically resolved   I have personally seen and evaluated the patient, evaluated laboratory and imaging results, formulated the assessment and plan and placed orders. The Patient requires high complexity decision making with multiple systems involvement.  Rounds were done with the Respiratory Therapy Director and Staff therapists and discussed with nursing staff also.  Yevonne Pax, MD [facility] Pulmonary Critical Care Medicine Sleep Medicine

## 2020-04-16 NOTE — Progress Notes (Signed)
[facility]  ROUNDING NOTE   Subjective:  Patient continues to have good urine output at 2.1 L over the preceding 24 hours. However BUN remains quite high at 154. Creatinine improved down to 3.5. Case discussed with dietitian.  Objective:  Vital signs in last 24 hours:  Temperature 96.4 pulse seventy-three respiration sixteen blood pressure 108/53   Physical Exam: General: No acute distress  Head: Normocephalic, atraumatic. 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  Neurologic: Awake, alert, following commands  Skin: No rash       Basic Metabolic Panel: Recent Labs  Lab 04/09/20 0833 04/09/20 0833 04/11/20 0559 04/13/20 0837 04/15/20 1557  NA 129*  --  133* 138 142  K 4.0  --  4.0 4.1 4.1  CL 81*  --  86* 87* 92*  CO2 35*  --  33* 38* 39*  GLUCOSE 119*  --  119* 127* 131*  BUN 128*  --  150* 154* 154*  CREATININE 3.87*  --  4.11* 4.07* 3.50*  CALCIUM 10.1   < > 9.8 10.8* 10.5*  PHOS 3.8  --   --   --   --    < > = values in this interval not displayed.    Liver Function Tests: Recent Labs  Lab 04/09/20 0833  ALBUMIN 2.1*   No results for input(s): LIPASE, AMYLASE in the last 168 hours. No results for input(s): AMMONIA in the last 168 hours.  CBC: Recent Labs  Lab 04/11/20 0559 04/13/20 0837  WBC 9.1 10.4  HGB 8.0* 8.4*  HCT 26.4* 28.2*  MCV 91.0 92.8  PLT 416* 419*    Cardiac Enzymes: No results for input(s): CKTOTAL, CKMB, CKMBINDEX, TROPONINI in the last 168 hours.  BNP: Invalid input(s): POCBNP  CBG: No results for input(s): GLUCAP in the last 168 hours.  Microbiology: Results for orders placed or performed during the hospital encounter of 03/02/20  Culture, respiratory     Status: None   Collection Time: 03/19/20  1:35 PM   Specimen:  Tracheal Aspirate  Result Value Ref Range Status   Specimen Description TRACHEAL ASPIRATE  Final   Special Requests NONE  Final   Gram Stain   Final    NO WBC SEEN FEW SQUAMOUS EPITHELIAL CELLS PRESENT NO ORGANISMS SEEN    Culture   Final    ABUNDANT PSEUDOMONAS AERUGINOSA SEE SEPARATE REPORT FOR ADDITIONAL SUSCEPTIBILITIES Performed at [employer] Performed at [facility], [address], [place], [place] [address]    Report Status 04/10/2020 FINAL  Final   Organism ID, Bacteria PSEUDOMONAS AERUGINOSA  Final      Susceptibility   Pseudomonas aeruginosa - MIC*    CEFTAZIDIME >=64 RESISTANT Resistant     CIPROFLOXACIN >=4 RESISTANT Resistant     GENTAMICIN 4 SENSITIVE Sensitive     IMIPENEM >=16 RESISTANT Resistant     CEFEPIME >=64 RESISTANT Resistant     * ABUNDANT PSEUDOMONAS AERUGINOSA  Culture, respiratory (non-expectorated)     Status: None   Collection Time: 03/20/20 10:17 AM   Specimen: Tracheal Aspirate; Respiratory  Result Value Ref Range Status   Specimen Description TRACHEAL ASPIRATE  Final   Special Requests NONE  Final   Gram Stain   Final    NO WBC SEEN RARE GRAM POSITIVE COCCI IN PAIRS FEW  GRAM VARIABLE ROD Performed at [facility], [place] [address], [place], [place] [address]    Culture ABUNDANT PSEUDOMONAS AERUGINOSA  Final   Report Status 03/22/2020 FINAL  Final   Organism ID, Bacteria PSEUDOMONAS AERUGINOSA  Final      Susceptibility   Pseudomonas aeruginosa - MIC*    CEFTAZIDIME >=64 RESISTANT Resistant     CIPROFLOXACIN 2 INTERMEDIATE Intermediate     GENTAMICIN 8 INTERMEDIATE Intermediate     IMIPENEM >=16 RESISTANT Resistant     CEFEPIME >=64 RESISTANT Resistant     * ABUNDANT PSEUDOMONAS AERUGINOSA    Coagulation Studies: No results for input(s): LABPROT, INR in the last 72 hours.  Urinalysis: No results for input(s): COLORURINE, LABSPEC, PHURINE, GLUCOSEU, HGBUR, BILIRUBINUR, KETONESUR, PROTEINUR, UROBILINOGEN, NITRITE,  LEUKOCYTESUR in the last 72 hours.  Invalid input(s): APPERANCEUR    Imaging: No results found.   Medications:     iohexol  Assessment/ Plan:  74 y.o. male with a PMHx of abdominal aortic aneurysm, right hepatic lobe lesion, cystic mass left kidney, BPH, history of tobacco abuse, hypertension, hyperlipidemia, COVID-19 infection,, acute respiratory failure secondary to COVID-19 infection and Pseudomonas who was admitted to [facility] on 03/02/2020 for ongoing management.  1.  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  Suspect that the patient has developed acute tubular necrosis.  He was also exposed to contrast on 03/24/2020.  -Creatinine down to 3.5 however patient still has high BUN at 154. Suspect azotemia in part secondary to concentrated tube feeds. We will switch the patient to a less concentrated tube feed after discussion with dietitian today. Continue free water flush of 100 cc every 2 hours..  2.  Anemia of chronic kidney disease. Continue periodically monitor CBC.  3.  Hyponatremia. Significantly improved. Serum sodium 142.   LOS: 0 Russell Rios 5/28/20218:28 AM

## 2020-04-17 DIAGNOSIS — N183 Chronic kidney disease, stage 3 unspecified: Secondary | ICD-10-CM | POA: Diagnosis not present

## 2020-04-17 DIAGNOSIS — J9621 Acute and chronic respiratory failure with hypoxia: Secondary | ICD-10-CM | POA: Diagnosis not present

## 2020-04-17 DIAGNOSIS — I482 Chronic atrial fibrillation, unspecified: Secondary | ICD-10-CM | POA: Diagnosis not present

## 2020-04-17 DIAGNOSIS — U071 COVID-19: Secondary | ICD-10-CM | POA: Diagnosis not present

## 2020-04-17 NOTE — Progress Notes (Signed)
Pulmonary Critical Care Medicine [facility] [place]   PULMONARY CRITICAL CARE SERVICE  PROGRESS NOTE  Date of Service: 04/17/2020  Russell Rios  YJE:[id number]  DOB: [birth year]   DOA: 03/02/2020  Referring Physician: Carron Curie, MD  HPI: Russell Rios is a 74 y.o. male seen for follow up of Acute on Chronic Respiratory Failure.  Patient is off ventilator on 3 L O2 doing well.  Good saturations are noted.  Medications: Reviewed on Rounds  Physical Exam:  Vitals: Temperature 97.3 pulse 77 respiratory 20 blood pressure is 111/56 saturations 98%  Ventilator Settings on 3 L O2  . General: Comfortable at this time . Eyes: Grossly normal lids, irises & conjunctiva . ENT: grossly tongue is normal . Neck: no obvious mass . Cardiovascular: S1 S2 normal no gallop . Respiratory: No rhonchi no rales are noted at this time . Abdomen: soft . Skin: no rash seen on limited exam . Musculoskeletal: not rigid . Psychiatric:unable to assess . Neurologic: no seizure no involuntary movements         Lab Data:   Basic Metabolic Panel: Recent Labs  Lab 04/11/20 0559 04/13/20 0837 04/15/20 1557  NA 133* 138 142  K 4.0 4.1 4.1  CL 86* 87* 92*  CO2 33* 38* 39*  GLUCOSE 119* 127* 131*  BUN 150* 154* 154*  CREATININE 4.11* 4.07* 3.50*  CALCIUM 9.8 10.8* 10.5*    ABG: No results for input(s): PHART, PCO2ART, PO2ART, HCO3, O2SAT in the last 168 hours.  Liver Function Tests: No results for input(s): AST, ALT, ALKPHOS, BILITOT, PROT, ALBUMIN in the last 168 hours. No results for input(s): LIPASE, AMYLASE in the last 168 hours. No results for input(s): AMMONIA in the last 168 hours.  CBC: Recent Labs  Lab 04/11/20 0559 04/13/20 0837  WBC 9.1 10.4  HGB 8.0* 8.4*  HCT 26.4* 28.2*  MCV 91.0 92.8  PLT 416* 419*    Cardiac Enzymes: No results for input(s): CKTOTAL, CKMB, CKMBINDEX, TROPONINI in the last 168 hours.  BNP (last 3 results) No results for input(s): BNP in  the last 8760 hours.  ProBNP (last 3 results) No results for input(s): PROBNP in the last 8760 hours.  Radiological Exams: No results found.  Assessment/Plan Active Problems:   Acute on chronic respiratory failure with hypoxia (HCC)   COVID-19 virus infection   Chronic atrial fibrillation with rapid ventricular response (HCC)   Chronic kidney disease, stage III (moderate)   Healthcare-associated pneumonia   1. Acute on chronic respiratory failure hypoxia we will continue with oxygen therapy titrate as tolerated continue secretion management supportive care. 2. COVID-19 virus infection resolved continue present management 3. Chronic atrial fibrillation rate controlled 4. Chronic kidney disease stage III supportive care we will continue with nephrology recommendations 5. Healthcare associated pneumonia treated clinically improved   I have personally seen and evaluated the patient, evaluated laboratory and imaging results, formulated the assessment and plan and placed orders. The Patient requires high complexity decision making with multiple systems involvement.  Rounds were done with the Respiratory Therapy Director and Staff therapists and discussed with nursing staff also.  Yevonne Pax, MD [facility] Pulmonary Critical Care Medicine Sleep Medicine

## 2020-04-18 DIAGNOSIS — U071 COVID-19: Secondary | ICD-10-CM | POA: Diagnosis not present

## 2020-04-18 DIAGNOSIS — I482 Chronic atrial fibrillation, unspecified: Secondary | ICD-10-CM | POA: Diagnosis not present

## 2020-04-18 DIAGNOSIS — N183 Chronic kidney disease, stage 3 unspecified: Secondary | ICD-10-CM | POA: Diagnosis not present

## 2020-04-18 DIAGNOSIS — J9621 Acute and chronic respiratory failure with hypoxia: Secondary | ICD-10-CM | POA: Diagnosis not present

## 2020-04-18 NOTE — Progress Notes (Signed)
Pulmonary Critical Care Medicine [facility] [place]   PULMONARY CRITICAL CARE SERVICE  PROGRESS NOTE  Date of Service: 04/18/2020  [name]  MGQ:[id number]  DOB: Oct 14, 1946   DOA: 03/02/2020  Referring Physician: Carron Curie, MD  HPI: Russell Rios is a 74 y.o. male seen for follow up of Acute on Chronic Respiratory Failure.  Patient at this time is on nasal cannula has been on 3 L with excellent saturations.  Patient appears to be comfortable right now without distress  Medications: Reviewed on Rounds  Physical Exam:  Vitals: Temperature is 96.9 pulse 88 respiratory rate 21 blood pressure is 118/59 saturations 96%  Ventilator Settings off the ventilator on 3 L nasal cannula.  . General: Comfortable at this time . Eyes: Grossly normal lids, irises & conjunctiva . ENT: grossly tongue is normal . Neck: no obvious mass . Cardiovascular: S1 S2 normal no gallop . Respiratory: No rhonchi no rales are noted at this time . Abdomen: soft . Skin: no rash seen on limited exam . Musculoskeletal: not rigid . Psychiatric:unable to assess . Neurologic: no seizure no involuntary movements         Lab Data:   Basic Metabolic Panel: Recent Labs  Lab 04/13/20 0837 04/15/20 1557  NA 138 142  K 4.1 4.1  CL 87* 92*  CO2 38* 39*  GLUCOSE 127* 131*  BUN 154* 154*  CREATININE 4.07* 3.50*  CALCIUM 10.8* 10.5*    ABG: No results for input(s): PHART, PCO2ART, PO2ART, HCO3, O2SAT in the last 168 hours.  Liver Function Tests: No results for input(s): AST, ALT, ALKPHOS, BILITOT, PROT, ALBUMIN in the last 168 hours. No results for input(s): LIPASE, AMYLASE in the last 168 hours. No results for input(s): AMMONIA in the last 168 hours.  CBC: Recent Labs  Lab 04/13/20 0837  WBC 10.4  HGB 8.4*  HCT 28.2*  MCV 92.8  PLT 419*    Cardiac Enzymes: No results for input(s): CKTOTAL, CKMB, CKMBINDEX, TROPONINI in the last 168 hours.  BNP (last 3 results) No results for  input(s): BNP in the last 8760 hours.  ProBNP (last 3 results) No results for input(s): PROBNP in the last 8760 hours.  Radiological Exams: No results found.  Assessment/Plan Active Problems:   Acute on chronic respiratory failure with hypoxia (HCC)   COVID-19 virus infection   Chronic atrial fibrillation with rapid ventricular response (HCC)   Chronic kidney disease, stage III (moderate)   Healthcare-associated pneumonia   1. Acute on chronic respiratory failure with hypoxia plan is to continue with oxygen therapy titrate as tolerated continue pulmonary toilet supportive care. 2. COVID-19 virus infection at baseline we will continue to monitor 3. Chronic atrial fibrillation rate is controlled 4. Chronic kidney disease stage III continue with supportive care being followed by nephrology 5. Healthcare associated pneumonia treated clinically improved   I have personally seen and evaluated the patient, evaluated laboratory and imaging results, formulated the assessment and plan and placed orders. The Patient requires high complexity decision making with multiple systems involvement.  Rounds were done with the Respiratory Therapy Director and Staff therapists and discussed with nursing staff also.  Yevonne Pax, MD [facility] Pulmonary Critical Care Medicine Sleep Medicine

## 2020-04-19 DIAGNOSIS — J9621 Acute and chronic respiratory failure with hypoxia: Secondary | ICD-10-CM | POA: Diagnosis not present

## 2020-04-19 DIAGNOSIS — U071 COVID-19: Secondary | ICD-10-CM | POA: Diagnosis not present

## 2020-04-19 DIAGNOSIS — I482 Chronic atrial fibrillation, unspecified: Secondary | ICD-10-CM | POA: Diagnosis not present

## 2020-04-19 DIAGNOSIS — N183 Chronic kidney disease, stage 3 unspecified: Secondary | ICD-10-CM | POA: Diagnosis not present

## 2020-04-19 LAB — BASIC METABOLIC PANEL
Anion gap: 9 (ref 5–15)
BUN: 119 mg/dL — ABNORMAL HIGH (ref 8–23)
CO2: 36 mmol/L — ABNORMAL HIGH (ref 22–32)
Calcium: 9.3 mg/dL (ref 8.9–10.3)
Chloride: 100 mmol/L (ref 98–111)
Creatinine, Ser: 2.74 mg/dL — ABNORMAL HIGH (ref 0.61–1.24)
GFR calc Af Amer: 25 mL/min — ABNORMAL LOW (ref >60)
GFR calc non Af Amer: 22 mL/min — ABNORMAL LOW (ref >60)
Glucose, Bld: 126 mg/dL — ABNORMAL HIGH (ref 70–99)
Potassium: 4.4 mmol/L (ref 3.5–5.1)
Sodium: 145 mmol/L (ref 135–145)

## 2020-04-19 LAB — CBC
HCT: 29.9 % — ABNORMAL LOW (ref 39.0–52.0)
Hemoglobin: 8.4 g/dL — ABNORMAL LOW (ref 13.0–17.0)
MCH: 27.3 pg (ref 26.0–34.0)
MCHC: 28.1 g/dL — ABNORMAL LOW (ref 30.0–36.0)
MCV: 97.1 fL (ref 80.0–100.0)
Platelets: 318 10*3/uL (ref 150–400)
RBC: 3.08 MIL/uL — ABNORMAL LOW (ref 4.22–5.81)
RDW: 19.7 % — ABNORMAL HIGH (ref 11.5–15.5)
WBC: 7.5 10*3/uL (ref 4.0–10.5)
nRBC: 0 % (ref 0.0–0.2)

## 2020-04-19 NOTE — Progress Notes (Signed)
Pulmonary Critical Care Medicine [facility] [place]   PULMONARY CRITICAL CARE SERVICE  PROGRESS NOTE  Date of Service: 04/19/2020  Russell Rios  BJS:[id number]  DOB: [birth year]   DOA: 03/02/2020  Referring Physician: Carron Curie, MD  HPI: Russell Rios is a 74 y.o. male seen for follow up of Acute on Chronic Respiratory Failure.  Patient currently is on 3 L oxygen good saturations.  Without any distress  Medications: Reviewed on Rounds  Physical Exam:  Vitals: Temperature is 96.7 pulse 83 respiratory rate 20 blood pressure is 142/69 saturations 96%  Ventilator Settings on 3 L oxygen with nasal cannula doing well  . General: Comfortable at this time . Eyes: Grossly normal lids, irises & conjunctiva . ENT: grossly tongue is normal . Neck: no obvious mass . Cardiovascular: S1 S2 normal no gallop . Respiratory: No rhonchi no rales are noted at this time . Abdomen: soft . Skin: no rash seen on limited exam . Musculoskeletal: not rigid . Psychiatric:unable to assess . Neurologic: no seizure no involuntary movements         Lab Data:   Basic Metabolic Panel: Recent Labs  Lab 04/13/20 0837 04/15/20 1557 04/19/20 0641  NA 138 142 145  K 4.1 4.1 4.4  CL 87* 92* 100  CO2 38* 39* 36*  GLUCOSE 127* 131* 126*  BUN 154* 154* 119*  CREATININE 4.07* 3.50* 2.74*  CALCIUM 10.8* 10.5* 9.3    ABG: No results for input(s): PHART, PCO2ART, PO2ART, HCO3, O2SAT in the last 168 hours.  Liver Function Tests: No results for input(s): AST, ALT, ALKPHOS, BILITOT, PROT, ALBUMIN in the last 168 hours. No results for input(s): LIPASE, AMYLASE in the last 168 hours. No results for input(s): AMMONIA in the last 168 hours.  CBC: Recent Labs  Lab 04/13/20 0837 04/19/20 0641  WBC 10.4 7.5  HGB 8.4* 8.4*  HCT 28.2* 29.9*  MCV 92.8 97.1  PLT 419* 318    Cardiac Enzymes: No results for input(s): CKTOTAL, CKMB, CKMBINDEX, TROPONINI in the last 168 hours.  BNP (last 3  results) No results for input(s): BNP in the last 8760 hours.  ProBNP (last 3 results) No results for input(s): PROBNP in the last 8760 hours.  Radiological Exams: No results found.  Assessment/Plan Active Problems:   Acute on chronic respiratory failure with hypoxia (HCC)   COVID-19 virus infection   Chronic atrial fibrillation with rapid ventricular response (HCC)   Chronic kidney disease, stage III (moderate)   Healthcare-associated pneumonia   1. Acute on chronic respiratory failure hypoxia we will continue with oxygen therapy titrate as tolerated continue pulmonary toilet. 2. COVID-19 virus infection resolved supportive care 3. Chronic atrial fibrillation rate is controlled 4. Chronic kidney disease stage III following labs nephrology is following along 5. Healthcare associated pneumonia treated we will continue to monitor   I have personally seen and evaluated the patient, evaluated laboratory and imaging results, formulated the assessment and plan and placed orders. The Patient requires high complexity decision making with multiple systems involvement.  Rounds were done with the Respiratory Therapy Director and Staff therapists and discussed with nursing staff also.  Yevonne Pax, MD [facility] Pulmonary Critical Care Medicine Sleep Medicine

## 2020-04-19 NOTE — Progress Notes (Signed)
[facility]  ROUNDING NOTE   Subjective:  BUN now down to 119 with a creatinine down to 2.74. Patient awake and alert this a.m.  Objective:  Vital signs in last 24 hours:  Temperature 96.7 pulse 83 respirations 20 blood pressure 142/69 urine output 2.2 L   Physical Exam: General: No acute distress  Head: Normocephalic, atraumatic. 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  Neurologic: Awake, alert, following commands  Skin: No rash       Basic Metabolic Panel: Recent Labs  Lab 04/13/20 0837 04/15/20 1557 04/19/20 0641  NA 138 142 145  K 4.1 4.1 4.4  CL 87* 92* 100  CO2 38* 39* 36*  GLUCOSE 127* 131* 126*  BUN 154* 154* 119*  CREATININE 4.07* 3.50* 2.74*  CALCIUM 10.8* 10.5* 9.3    Liver Function Tests: No results for input(s): AST, ALT, ALKPHOS, BILITOT, PROT, ALBUMIN in the last 168 hours. No results for input(s): LIPASE, AMYLASE in the last 168 hours. No results for input(s): AMMONIA in the last 168 hours.  CBC: Recent Labs  Lab 04/13/20 0837 04/19/20 0641  WBC 10.4 7.5  HGB 8.4* 8.4*  HCT 28.2* 29.9*  MCV 92.8 97.1  PLT 419* 318    Cardiac Enzymes: No results for input(s): CKTOTAL, CKMB, CKMBINDEX, TROPONINI in the last 168 hours.  BNP: Invalid input(s): POCBNP  CBG: No results for input(s): GLUCAP in the last 168 hours.  Microbiology: Results for orders placed or performed during the hospital encounter of 03/02/20  Culture, respiratory     Status: None   Collection Time: 03/19/20  1:35 PM   Specimen: Tracheal Aspirate  Result Value Ref Range Status   Specimen Description TRACHEAL ASPIRATE  Final   Special Requests NONE  Final   Gram Stain   Final    NO WBC SEEN FEW SQUAMOUS EPITHELIAL CELLS PRESENT NO ORGANISMS SEEN    Culture   Final   ABUNDANT PSEUDOMONAS AERUGINOSA SEE SEPARATE REPORT FOR ADDITIONAL SUSCEPTIBILITIES Performed at [employer] Performed at [facility], [place] [address], [place], [place] [address]    Report Status 04/10/2020 FINAL  Final   Organism ID, Bacteria PSEUDOMONAS AERUGINOSA  Final      Susceptibility   Pseudomonas aeruginosa - MIC*    CEFTAZIDIME >=64 RESISTANT Resistant     CIPROFLOXACIN >=4 RESISTANT Resistant     GENTAMICIN 4 SENSITIVE Sensitive     IMIPENEM >=16 RESISTANT Resistant     CEFEPIME >=64 RESISTANT Resistant     * ABUNDANT PSEUDOMONAS AERUGINOSA  Culture, respiratory (non-expectorated)     Status: None   Collection Time: 03/20/20 10:17 AM   Specimen: Tracheal Aspirate; Respiratory  Result Value Ref Range Status   Specimen Description TRACHEAL ASPIRATE  Final   Special Requests NONE  Final   Gram Stain   Final    NO WBC SEEN RARE GRAM POSITIVE COCCI IN PAIRS FEW GRAM VARIABLE ROD Performed at [facility], [place] [address] [place],  [address]    Culture ABUNDANT PSEUDOMONAS AERUGINOSA  Final   Report Status 03/22/2020 FINAL  Final   Organism ID, Bacteria PSEUDOMONAS AERUGINOSA  Final      Susceptibility   Pseudomonas aeruginosa - MIC*    CEFTAZIDIME >=64 RESISTANT Resistant     CIPROFLOXACIN 2 INTERMEDIATE Intermediate  GENTAMICIN 8 INTERMEDIATE Intermediate     IMIPENEM >=16 RESISTANT Resistant     CEFEPIME >=64 RESISTANT Resistant     * ABUNDANT PSEUDOMONAS AERUGINOSA    Coagulation Studies: No results for input(s): LABPROT, INR in the last 72 hours.  Urinalysis: No results for input(s): COLORURINE, LABSPEC, PHURINE, GLUCOSEU, HGBUR, BILIRUBINUR, KETONESUR, PROTEINUR, UROBILINOGEN, NITRITE, LEUKOCYTESUR in the last 72 hours.  Invalid input(s): APPERANCEUR    Imaging: No results found.   Medications:     iohexol  Assessment/ Plan:  74 y.o. male with a PMHx of abdominal aortic aneurysm, right hepatic lobe lesion, cystic  mass left kidney, BPH, history of tobacco abuse, hypertension, hyperlipidemia, COVID-19 infection,, acute respiratory failure secondary to COVID-19 infection and Pseudomonas who was admitted to [facility] on 03/02/2020 for ongoing management.  1.  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  Suspect that the patient has developed acute tubular necrosis.  He was also exposed to contrast on 03/24/2020.  -Creatinine down further to 2.7 with BUN down to 119.  DC IV fluids for now though we may need to restart these if BUN starts rising again.  2.  Anemia of chronic kidney disease.  Hemoglobin up to 8.4.  No indication for transfusion.  Continue to monitor blood count.  3.  Hyponatremia.  Serum sodium was up to 145 at last check.   LOS: 0 Joliana Claflin 5/31/20218:43 AM

## 2020-04-20 DIAGNOSIS — N183 Chronic kidney disease, stage 3 unspecified: Secondary | ICD-10-CM | POA: Diagnosis not present

## 2020-04-20 DIAGNOSIS — U071 COVID-19: Secondary | ICD-10-CM | POA: Diagnosis not present

## 2020-04-20 DIAGNOSIS — J9621 Acute and chronic respiratory failure with hypoxia: Secondary | ICD-10-CM | POA: Diagnosis not present

## 2020-04-20 DIAGNOSIS — I482 Chronic atrial fibrillation, unspecified: Secondary | ICD-10-CM | POA: Diagnosis not present

## 2020-04-20 LAB — URINALYSIS, ROUTINE W REFLEX MICROSCOPIC
Bacteria, UA: NONE SEEN
Bilirubin Urine: NEGATIVE
Glucose, UA: NEGATIVE mg/dL
Hgb urine dipstick: NEGATIVE
Ketones, ur: NEGATIVE mg/dL
Leukocytes,Ua: NEGATIVE
Nitrite: NEGATIVE
Protein, ur: 30 mg/dL — AB
Specific Gravity, Urine: 1.013 (ref 1.005–1.030)
pH: 9 — ABNORMAL HIGH (ref 5.0–8.0)

## 2020-04-20 NOTE — Progress Notes (Signed)
Pulmonary Critical Care Medicine [facility] [place]   PULMONARY CRITICAL CARE SERVICE  PROGRESS NOTE  Date of Service: 04/20/2020  Russell Rios  GUY:[id number]  DOB: Mar 08, 1946   DOA: 03/02/2020  Referring Physician: Carron Curie, MD  HPI: Russell Rios is a 74 y.o. male seen for follow up of Acute on Chronic Respiratory Failure.  Patient is on 3 L nasal cannula good saturations are noted  Medications: Reviewed on Rounds  Physical Exam:  Vitals: Temperature is 97.1 pulse 92 respiratory rate 25 blood pressure is 119/67 saturations 97%  Ventilator Settings on 3 L nasal cannula  . General: Comfortable at this time . Eyes: Grossly normal lids, irises & conjunctiva . ENT: grossly tongue is normal . Neck: no obvious mass . Cardiovascular: S1 S2 normal no gallop . Respiratory: No rhonchi no rales are noted at this time . Abdomen: soft . Skin: no rash seen on limited exam . Musculoskeletal: not rigid . Psychiatric:unable to assess . Neurologic: no seizure no involuntary movements         Lab Data:   Basic Metabolic Panel: Recent Labs  Lab 04/15/20 1557 04/19/20 0641  NA 142 145  K 4.1 4.4  CL 92* 100  CO2 39* 36*  GLUCOSE 131* 126*  BUN 154* 119*  CREATININE 3.50* 2.74*  CALCIUM 10.5* 9.3    ABG: No results for input(s): PHART, PCO2ART, PO2ART, HCO3, O2SAT in the last 168 hours.  Liver Function Tests: No results for input(s): AST, ALT, ALKPHOS, BILITOT, PROT, ALBUMIN in the last 168 hours. No results for input(s): LIPASE, AMYLASE in the last 168 hours. No results for input(s): AMMONIA in the last 168 hours.  CBC: Recent Labs  Lab 04/19/20 0641  WBC 7.5  HGB 8.4*  HCT 29.9*  MCV 97.1  PLT 318    Cardiac Enzymes: No results for input(s): CKTOTAL, CKMB, CKMBINDEX, TROPONINI in the last 168 hours.  BNP (last 3 results) No results for input(s): BNP in the last 8760 hours.  ProBNP (last 3 results) No results for input(s): PROBNP in the last  8760 hours.  Radiological Exams: No results found.  Assessment/Plan Active Problems:   Acute on chronic respiratory failure with hypoxia (HCC)   COVID-19 virus infection   Chronic atrial fibrillation with rapid ventricular response (HCC)   Chronic kidney disease, stage III (moderate)   Healthcare-associated pneumonia   1. Acute on chronic respiratory failure hypoxia we will continue with oxygen therapy supportive care nasal cannula 2. COVID-19 virus infection resolved 3. Chronic atrial fibrillation rate is controlled at this time we will continue to monitor on telemetry 4. Chronic kidney disease stage III following with nephrology recommendations 5. Healthcare associated pneumonia treated we will continue with supportive care   I have personally seen and evaluated the patient, evaluated laboratory and imaging results, formulated the assessment and plan and placed orders. The Patient requires high complexity decision making with multiple systems involvement.  Rounds were done with the Respiratory Therapy Director and Staff therapists and discussed with nursing staff also.  Yevonne Pax, MD [facility] Pulmonary Critical Care Medicine Sleep Medicine

## 2020-04-21 DIAGNOSIS — J9621 Acute and chronic respiratory failure with hypoxia: Secondary | ICD-10-CM | POA: Diagnosis not present

## 2020-04-21 DIAGNOSIS — U071 COVID-19: Secondary | ICD-10-CM | POA: Diagnosis not present

## 2020-04-21 DIAGNOSIS — N183 Chronic kidney disease, stage 3 unspecified: Secondary | ICD-10-CM | POA: Diagnosis not present

## 2020-04-21 DIAGNOSIS — I482 Chronic atrial fibrillation, unspecified: Secondary | ICD-10-CM | POA: Diagnosis not present

## 2020-04-21 LAB — URINE CULTURE: Culture: NO GROWTH

## 2020-04-21 LAB — RENAL FUNCTION PANEL
Albumin: 2.5 g/dL — ABNORMAL LOW (ref 3.5–5.0)
Anion gap: 7 (ref 5–15)
BUN: 84 mg/dL — ABNORMAL HIGH (ref 8–23)
CO2: 36 mmol/L — ABNORMAL HIGH (ref 22–32)
Calcium: 9.7 mg/dL (ref 8.9–10.3)
Chloride: 104 mmol/L (ref 98–111)
Creatinine, Ser: 2.62 mg/dL — ABNORMAL HIGH (ref 0.61–1.24)
GFR calc Af Amer: 27 mL/min — ABNORMAL LOW (ref >60)
GFR calc non Af Amer: 23 mL/min — ABNORMAL LOW (ref >60)
Glucose, Bld: 124 mg/dL — ABNORMAL HIGH (ref 70–99)
Phosphorus: 2.4 mg/dL — ABNORMAL LOW (ref 2.5–4.6)
Potassium: 4.8 mmol/L (ref 3.5–5.1)
Sodium: 147 mmol/L — ABNORMAL HIGH (ref 135–145)

## 2020-04-21 NOTE — Progress Notes (Addendum)
Pulmonary Critical Care Medicine [facility] [place]   PULMONARY CRITICAL CARE SERVICE  PROGRESS NOTE  Date of Service: 04/21/2020  Russell Rios  QIH:[id number]  DOB: 1946-07-17   DOA: 03/02/2020  Referring Physician: Carron Curie, MD  HPI: Russell Rios is a 74 y.o. male seen for follow up of Acute on Chronic Respiratory Failure.  Patient remains decannulated is currently on 2 L of oxygen via nasal cannula satting well no fever distress.  Medications: Reviewed on Rounds  Physical Exam:  Vitals: Pulse 83 respirations 20 BP 117/80 O2 sat 90% temp 96.8  Ventilator Settings 2 L nasal cannula  . General: Comfortable at this time . Eyes: Grossly normal lids, irises & conjunctiva . ENT: grossly tongue is normal . Neck: no obvious mass . Cardiovascular: S1 S2 normal no gallop . Respiratory: No rales or rhonchi noted . Abdomen: soft . Skin: no rash seen on limited exam . Musculoskeletal: not rigid . Psychiatric:unable to assess . Neurologic: no seizure no involuntary movements         Lab Data:   Basic Metabolic Panel: Recent Labs  Lab 04/15/20 1557 04/19/20 0641 04/21/20 0927  NA 142 145 147*  K 4.1 4.4 4.8  CL 92* 100 104  CO2 39* 36* 36*  GLUCOSE 131* 126* 124*  BUN 154* 119* 84*  CREATININE 3.50* 2.74* 2.62*  CALCIUM 10.5* 9.3 9.7  PHOS  --   --  2.4*    ABG: No results for input(s): PHART, PCO2ART, PO2ART, HCO3, O2SAT in the last 168 hours.  Liver Function Tests: Recent Labs  Lab 04/21/20 0927  ALBUMIN 2.5*   No results for input(s): LIPASE, AMYLASE in the last 168 hours. No results for input(s): AMMONIA in the last 168 hours.  CBC: Recent Labs  Lab 04/19/20 0641  WBC 7.5  HGB 8.4*  HCT 29.9*  MCV 97.1  PLT 318    Cardiac Enzymes: No results for input(s): CKTOTAL, CKMB, CKMBINDEX, TROPONINI in the last 168 hours.  BNP (last 3 results) No results for input(s): BNP in the last 8760 hours.  ProBNP (last 3 results) No results for  input(s): PROBNP in the last 8760 hours.  Radiological Exams: No results found.  Assessment/Plan Active Problems:   Acute on chronic respiratory failure with hypoxia (HCC)   COVID-19 virus infection   Chronic atrial fibrillation with rapid ventricular response (HCC)   Chronic kidney disease, stage III (moderate)   Healthcare-associated pneumonia   1. Acute on chronic respiratory failure hypoxia we will continue with oxygen therapy supportive care nasal cannula 2. COVID-19 virus infection resolved 3. Chronic atrial fibrillation rate is controlled at this time we will continue to monitor on telemetry 4. Chronic kidney disease stage III following with nephrology recommendations 5. Healthcare associated pneumonia treated we will continue with supportive care   I have personally seen and evaluated the patient, evaluated laboratory and imaging results, formulated the assessment and plan and placed orders. The Patient requires high complexity decision making with multiple systems involvement.  Rounds were done with the Respiratory Therapy Director and Staff therapists and discussed with nursing staff also.  Yevonne Pax, MD [facility] Pulmonary Critical Care Medicine Sleep Medicine

## 2020-04-21 NOTE — Progress Notes (Signed)
[facility]  ROUNDING NOTE   Subjective:  Patient seen and evaluated at bedside. No new renal function testing today. Good urine output noted.  Objective:  Vital signs in last 24 hours:  Temperature 96.8 pulse 83 respirations 20 blood pressure 117/80   Physical Exam: General: No acute distress  Head: Normocephalic, atraumatic. 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  Neurologic: Awake, alert, following commands  Skin: No rash       Basic Metabolic Panel: Recent Labs  Lab 04/15/20 1557 04/19/20 0641  NA 142 145  K 4.1 4.4  CL 92* 100  CO2 39* 36*  GLUCOSE 131* 126*  BUN 154* 119*  CREATININE 3.50* 2.74*  CALCIUM 10.5* 9.3    Liver Function Tests: No results for input(s): AST, ALT, ALKPHOS, BILITOT, PROT, ALBUMIN in the last 168 hours. No results for input(s): LIPASE, AMYLASE in the last 168 hours. No results for input(s): AMMONIA in the last 168 hours.  CBC: Recent Labs  Lab 04/19/20 0641  WBC 7.5  HGB 8.4*  HCT 29.9*  MCV 97.1  PLT 318    Cardiac Enzymes: No results for input(s): CKTOTAL, CKMB, CKMBINDEX, TROPONINI in the last 168 hours.  BNP: Invalid input(s): POCBNP  CBG: No results for input(s): GLUCAP in the last 168 hours.  Microbiology: Results for orders placed or performed during the hospital encounter of 03/02/20  Culture, respiratory     Status: None   Collection Time: 03/19/20  1:35 PM   Specimen: Tracheal Aspirate  Result Value Ref Range Status   Specimen Description TRACHEAL ASPIRATE  Final   Special Requests NONE  Final   Gram Stain   Final    NO WBC SEEN FEW SQUAMOUS EPITHELIAL CELLS PRESENT NO ORGANISMS SEEN    Culture   Final    ABUNDANT PSEUDOMONAS AERUGINOSA SEE SEPARATE REPORT FOR ADDITIONAL SUSCEPTIBILITIES Performed  at [employer] Performed at [facility], [address] [place], [place] [address]    Report Status 04/10/2020 FINAL  Final   Organism ID, Bacteria PSEUDOMONAS AERUGINOSA  Final      Susceptibility   Pseudomonas aeruginosa - MIC*    CEFTAZIDIME >=64 RESISTANT Resistant     CIPROFLOXACIN >=4 RESISTANT Resistant     GENTAMICIN 4 SENSITIVE Sensitive     IMIPENEM >=16 RESISTANT Resistant     CEFEPIME >=64 RESISTANT Resistant     * ABUNDANT PSEUDOMONAS AERUGINOSA  Culture, respiratory (non-expectorated)     Status: None   Collection Time: 03/20/20 10:17 AM   Specimen: Tracheal Aspirate; Respiratory  Result Value Ref Range Status   Specimen Description TRACHEAL ASPIRATE  Final   Special Requests NONE  Final   Gram Stain   Final    NO WBC SEEN RARE GRAM POSITIVE COCCI IN PAIRS FEW GRAM VARIABLE ROD Performed at [facility], [address] [place], [place] [address]    Culture ABUNDANT PSEUDOMONAS AERUGINOSA  Final   Report Status 03/22/2020 FINAL  Final   Organism ID, Bacteria PSEUDOMONAS AERUGINOSA  Final      Susceptibility   Pseudomonas aeruginosa - MIC*    CEFTAZIDIME >=64 RESISTANT Resistant     CIPROFLOXACIN 2 INTERMEDIATE Intermediate     GENTAMICIN 8 INTERMEDIATE Intermediate     IMIPENEM >=16 RESISTANT Resistant     CEFEPIME >=64  RESISTANT Resistant     * ABUNDANT PSEUDOMONAS AERUGINOSA    Coagulation Studies: No results for input(s): LABPROT, INR in the last 72 hours.  Urinalysis: Recent Labs    04/20/20 1255  COLORURINE YELLOW  LABSPEC 1.013  PHURINE 9.0*  GLUCOSEU NEGATIVE  HGBUR NEGATIVE  BILIRUBINUR NEGATIVE  KETONESUR NEGATIVE  PROTEINUR 30*  NITRITE NEGATIVE  LEUKOCYTESUR NEGATIVE      Imaging: No results found.   Medications:     iohexol  Assessment/ Plan:  74 y.o. male with a PMHx of abdominal aortic aneurysm, right hepatic lobe lesion, cystic mass left kidney, BPH, history of tobacco abuse, hypertension,  hyperlipidemia, COVID-19 infection,, acute respiratory failure secondary to COVID-19 infection and Pseudomonas who was admitted to [facility] on 03/02/2020 for ongoing management.  1.  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  Suspect that the patient has developed acute tubular necrosis.  He was also exposed to contrast on 03/24/2020.  -We will order repeat renal function testing today.  Continue current tube feeds and free water flush of 100 cc every 2 hours.  2.  Anemia of chronic kidney disease.  Continue periodically monitor CBC.  3.  Hyponatremia.  Awaiting recheck serum sodium today.   LOS: 0 Dyanara Cozza 6/2/20219:02 AM

## 2020-04-22 DIAGNOSIS — I482 Chronic atrial fibrillation, unspecified: Secondary | ICD-10-CM | POA: Diagnosis not present

## 2020-04-22 DIAGNOSIS — J9621 Acute and chronic respiratory failure with hypoxia: Secondary | ICD-10-CM | POA: Diagnosis not present

## 2020-04-22 DIAGNOSIS — U071 COVID-19: Secondary | ICD-10-CM | POA: Diagnosis not present

## 2020-04-22 DIAGNOSIS — N183 Chronic kidney disease, stage 3 unspecified: Secondary | ICD-10-CM | POA: Diagnosis not present

## 2020-04-22 NOTE — Progress Notes (Signed)
Pulmonary Critical Care Medicine [facility] [place]   PULMONARY CRITICAL CARE SERVICE  PROGRESS NOTE  Date of Service: 04/22/2020  Russell Rios  OXB:[id number]  DOB: 1946/05/28   DOA: 03/02/2020  Referring Physician: Carron Curie, MD  HPI: Russell Rios is a 74 y.o. male seen for follow up of Acute on Chronic Respiratory Failure.  Patient is on 2 L nasal cannula appears to be comfortable right now without distress at this time  Medications: Reviewed on Rounds  Physical Exam:  Vitals: Temperature is 96.3 pulse 88 respiratory rate 20 blood pressure is 113/59 saturations 100%  Ventilator Settings on 2 L nasal cannula  . General: Comfortable at this time . Eyes: Grossly normal lids, irises & conjunctiva . ENT: grossly tongue is normal . Neck: no obvious mass . Cardiovascular: S1 S2 normal no gallop . Respiratory: No rhonchi no rales noted at this time . Abdomen: soft . Skin: no rash seen on limited exam . Musculoskeletal: not rigid . Psychiatric:unable to assess . Neurologic: no seizure no involuntary movements         Lab Data:   Basic Metabolic Panel: Recent Labs  Lab 04/15/20 1557 04/19/20 0641 04/21/20 0927  NA 142 145 147*  K 4.1 4.4 4.8  CL 92* 100 104  CO2 39* 36* 36*  GLUCOSE 131* 126* 124*  BUN 154* 119* 84*  CREATININE 3.50* 2.74* 2.62*  CALCIUM 10.5* 9.3 9.7  PHOS  --   --  2.4*    ABG: No results for input(s): PHART, PCO2ART, PO2ART, HCO3, O2SAT in the last 168 hours.  Liver Function Tests: Recent Labs  Lab 04/21/20 0927  ALBUMIN 2.5*   No results for input(s): LIPASE, AMYLASE in the last 168 hours. No results for input(s): AMMONIA in the last 168 hours.  CBC: Recent Labs  Lab 04/19/20 0641  WBC 7.5  HGB 8.4*  HCT 29.9*  MCV 97.1  PLT 318    Cardiac Enzymes: No results for input(s): CKTOTAL, CKMB, CKMBINDEX, TROPONINI in the last 168 hours.  BNP (last 3 results) No results for input(s): BNP in the last 8760  hours.  ProBNP (last 3 results) No results for input(s): PROBNP in the last 8760 hours.  Radiological Exams: No results found.  Assessment/Plan Active Problems:   Acute on chronic respiratory failure with hypoxia (HCC)   COVID-19 virus infection   Chronic atrial fibrillation with rapid ventricular response (HCC)   Chronic kidney disease, stage III (moderate)   Healthcare-associated pneumonia   1. Acute on chronic respiratory failure hypoxia clinically is improving still however on 2 L of oxygen titrate as tolerated. 2. COVID-19 virus infection in resolution phase we will continue with supportive care 3. Chronic atrial fibrillation rate is controlled we will continue to monitor 4. Chronic kidney disease stage III at baseline 5. Healthcare associated pneumonia treated we will continue with supportive care   I have personally seen and evaluated the patient, evaluated laboratory and imaging results, formulated the assessment and plan and placed orders. The Patient requires high complexity decision making with multiple systems involvement.  Rounds were done with the Respiratory Therapy Director and Staff therapists and discussed with nursing staff also.  Yevonne Pax, MD [facility] Pulmonary Critical Care Medicine Sleep Medicine

## 2020-04-23 DIAGNOSIS — J9621 Acute and chronic respiratory failure with hypoxia: Secondary | ICD-10-CM | POA: Diagnosis not present

## 2020-04-23 DIAGNOSIS — U071 COVID-19: Secondary | ICD-10-CM | POA: Diagnosis not present

## 2020-04-23 DIAGNOSIS — N183 Chronic kidney disease, stage 3 unspecified: Secondary | ICD-10-CM | POA: Diagnosis not present

## 2020-04-23 DIAGNOSIS — I482 Chronic atrial fibrillation, unspecified: Secondary | ICD-10-CM | POA: Diagnosis not present

## 2020-04-23 LAB — CBC
HCT: 28.6 % — ABNORMAL LOW (ref 39.0–52.0)
Hemoglobin: 8.1 g/dL — ABNORMAL LOW (ref 13.0–17.0)
MCH: 27.8 pg (ref 26.0–34.0)
MCHC: 28.3 g/dL — ABNORMAL LOW (ref 30.0–36.0)
MCV: 98.3 fL (ref 80.0–100.0)
Platelets: 256 10*3/uL (ref 150–400)
RBC: 2.91 MIL/uL — ABNORMAL LOW (ref 4.22–5.81)
RDW: 20.3 % — ABNORMAL HIGH (ref 11.5–15.5)
WBC: 6.6 10*3/uL (ref 4.0–10.5)
nRBC: 0 % (ref 0.0–0.2)

## 2020-04-23 LAB — BASIC METABOLIC PANEL
Anion gap: 10 (ref 5–15)
BUN: 70 mg/dL — ABNORMAL HIGH (ref 8–23)
CO2: 30 mmol/L (ref 22–32)
Calcium: 9.2 mg/dL (ref 8.9–10.3)
Chloride: 107 mmol/L (ref 98–111)
Creatinine, Ser: 2.26 mg/dL — ABNORMAL HIGH (ref 0.61–1.24)
GFR calc Af Amer: 32 mL/min — ABNORMAL LOW (ref >60)
GFR calc non Af Amer: 28 mL/min — ABNORMAL LOW (ref >60)
Glucose, Bld: 105 mg/dL — ABNORMAL HIGH (ref 70–99)
Potassium: 4.3 mmol/L (ref 3.5–5.1)
Sodium: 147 mmol/L — ABNORMAL HIGH (ref 135–145)

## 2020-04-23 NOTE — Progress Notes (Addendum)
Pulmonary Critical Care Medicine [facility] [place]   PULMONARY CRITICAL CARE SERVICE  PROGRESS NOTE  Date of Service: 04/23/2020  Russell Rios  EXH:[id number]  DOB: 25-Oct-1946   DOA: 03/02/2020  Referring Physician: Carron Curie, MD  HPI: Russell Rios is a 74 y.o. male seen for follow up of Acute on Chronic Respiratory Failure.  Patient remains on 2 L nasal cannula satting well no distress.  Medications: Reviewed on Rounds  Physical Exam:  Vitals: Pulse 89 respirations 24 BP 119/64 O2 sat 99% temp 98.7  Ventilator Settings 2 L nasal cannula  . General: Comfortable at this time . Eyes: Grossly normal lids, irises & conjunctiva . ENT: grossly tongue is normal . Neck: no obvious mass . Cardiovascular: S1 S2 normal no gallop . Respiratory: No rales or rhonchi . Abdomen: soft . Skin: no rash seen on limited exam . Musculoskeletal: not rigid . Psychiatric:unable to assess . Neurologic: no seizure no involuntary movements         Lab Data:   Basic Metabolic Panel: Recent Labs  Lab 04/19/20 0641 04/21/20 0927 04/23/20 0539  NA 145 147* 147*  K 4.4 4.8 4.3  CL 100 104 107  CO2 36* 36* 30  GLUCOSE 126* 124* 105*  BUN 119* 84* 70*  CREATININE 2.74* 2.62* 2.26*  CALCIUM 9.3 9.7 9.2  PHOS  --  2.4*  --     ABG: No results for input(s): PHART, PCO2ART, PO2ART, HCO3, O2SAT in the last 168 hours.  Liver Function Tests: Recent Labs  Lab 04/21/20 0927  ALBUMIN 2.5*   No results for input(s): LIPASE, AMYLASE in the last 168 hours. No results for input(s): AMMONIA in the last 168 hours.  CBC: Recent Labs  Lab 04/19/20 0641 04/23/20 0539  WBC 7.5 6.6  HGB 8.4* 8.1*  HCT 29.9* 28.6*  MCV 97.1 98.3  PLT 318 256    Cardiac Enzymes: No results for input(s): CKTOTAL, CKMB, CKMBINDEX, TROPONINI in the last 168 hours.  BNP (last 3 results) No results for input(s): BNP in the last 8760 hours.  ProBNP (last 3 results) No results for input(s): PROBNP  in the last 8760 hours.  Radiological Exams: No results found.  Assessment/Plan Active Problems:   Acute on chronic respiratory failure with hypoxia (HCC)   COVID-19 virus infection   Chronic atrial fibrillation with rapid ventricular response (HCC)   Chronic kidney disease, stage III (moderate)   Healthcare-associated pneumonia   1. Acute on chronic respiratory failure hypoxia clinically is improving still however on 2 L of oxygen titrate as tolerated. 2. COVID-19 virus infection in resolution phase we will continue with supportive care 3. Chronic atrial fibrillation rate is controlled we will continue to monitor 4. Chronic kidney disease stage III at baseline 5. Healthcare associated pneumonia treated we will continue with supportive care   I have personally seen and evaluated the patient, evaluated laboratory and imaging results, formulated the assessment and plan and placed orders. The Patient requires high complexity decision making with multiple systems involvement.  Rounds were done with the Respiratory Therapy Director and Staff therapists and discussed with nursing staff also.  Yevonne Pax, MD [facility] Pulmonary Critical Care Medicine Sleep Medicine

## 2020-04-23 NOTE — Progress Notes (Signed)
[facility]  ROUNDING NOTE   Subjective:  Patient sitting up in chair this a.m. Creatinine now down to 2.2 with BUN of 70. Urine output 1.8 L over the preceding 24 hours.  Objective:  Vital signs in last 24 hours:  Temperature 98.7 pulse 89 respirations 74 blood pressure 119/68   Physical Exam: General: No acute distress  Head: Normocephalic, atraumatic. 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  Neurologic: Awake, alert, following commands  Skin: No rash       Basic Metabolic Panel: Recent Labs  Lab 04/19/20 0641 04/21/20 0927 04/23/20 0539  NA 145 147* 147*  K 4.4 4.8 4.3  CL 100 104 107  CO2 36* 36* 30  GLUCOSE 126* 124* 105*  BUN 119* 84* 70*  CREATININE 2.74* 2.62* 2.26*  CALCIUM 9.3 9.7 9.2  PHOS  --  2.4*  --     Liver Function Tests: Recent Labs  Lab 04/21/20 0927  ALBUMIN 2.5*   No results for input(s): LIPASE, AMYLASE in the last 168 hours. No results for input(s): AMMONIA in the last 168 hours.  CBC: Recent Labs  Lab 04/19/20 0641 04/23/20 0539  WBC 7.5 6.6  HGB 8.4* 8.1*  HCT 29.9* 28.6*  MCV 97.1 98.3  PLT 318 256    Cardiac Enzymes: No results for input(s): CKTOTAL, CKMB, CKMBINDEX, TROPONINI in the last 168 hours.  BNP: Invalid input(s): POCBNP  CBG: No results for input(s): GLUCAP in the last 168 hours.  Microbiology: Results for orders placed or performed during the hospital encounter of 03/02/20  Culture, respiratory     Status: None   Collection Time: 03/19/20  1:35 PM   Specimen: Tracheal Aspirate  Result Value Ref Range Status   Specimen Description TRACHEAL ASPIRATE  Final   Special Requests NONE  Final   Gram Stain   Final    NO WBC SEEN FEW SQUAMOUS EPITHELIAL CELLS PRESENT NO ORGANISMS SEEN    Culture   Final   ABUNDANT PSEUDOMONAS AERUGINOSA SEE SEPARATE REPORT FOR ADDITIONAL SUSCEPTIBILITIES Performed at [employer] Performed at [facility], [place] [address], [place], [place] [address]    Report Status 04/10/2020 FINAL  Final   Organism ID, Bacteria PSEUDOMONAS AERUGINOSA  Final      Susceptibility   Pseudomonas aeruginosa - MIC*    CEFTAZIDIME >=64 RESISTANT Resistant     CIPROFLOXACIN >=4 RESISTANT Resistant     GENTAMICIN 4 SENSITIVE Sensitive     IMIPENEM >=16 RESISTANT Resistant     CEFEPIME >=64 RESISTANT Resistant     * ABUNDANT PSEUDOMONAS AERUGINOSA  Culture, respiratory (non-expectorated)     Status: None   Collection Time: 03/20/20 10:17 AM   Specimen: Tracheal Aspirate; Respiratory  Result Value Ref Range Status   Specimen Description TRACHEAL ASPIRATE  Final   Special Requests NONE  Final   Gram Stain   Final    NO WBC SEEN RARE GRAM POSITIVE COCCI IN PAIRS FEW GRAM VARIABLE ROD Performed at [facility], [place] [address], [place], [place] [address]    Culture ABUNDANT PSEUDOMONAS AERUGINOSA  Final   Report Status 03/22/2020 FINAL  Final   Organism ID, Bacteria PSEUDOMONAS AERUGINOSA  Final      Susceptibility   Pseudomonas aeruginosa - MIC*    CEFTAZIDIME >=64 RESISTANT Resistant  CIPROFLOXACIN 2 INTERMEDIATE Intermediate     GENTAMICIN 8 INTERMEDIATE Intermediate     IMIPENEM >=16 RESISTANT Resistant     CEFEPIME >=64 RESISTANT Resistant     * ABUNDANT PSEUDOMONAS AERUGINOSA  Culture, Urine     Status: None   Collection Time: 04/20/20 12:10 PM   Specimen: Urine, Clean Catch  Result Value Ref Range Status   Specimen Description URINE, CLEAN CATCH  Final   Special Requests NONE  Final   Culture   Final    NO GROWTH Performed at [facility], [address], [place], [place] [address]    Report Status 04/21/2020 FINAL  Final    Coagulation Studies: No results for input(s): LABPROT, INR in the last 72 hours.  Urinalysis: Recent  Labs    04/20/20 1255  COLORURINE YELLOW  LABSPEC 1.013  PHURINE 9.0*  GLUCOSEU NEGATIVE  HGBUR NEGATIVE  BILIRUBINUR NEGATIVE  KETONESUR NEGATIVE  PROTEINUR 30*  NITRITE NEGATIVE  LEUKOCYTESUR NEGATIVE      Imaging: No results found.   Medications:     iohexol  Assessment/ Plan:  74 y.o. male with a PMHx of abdominal aortic aneurysm, right hepatic lobe lesion, cystic mass left kidney, BPH, history of tobacco abuse, hypertension, hyperlipidemia, COVID-19 infection,, acute respiratory failure secondary to COVID-19 infection and Pseudomonas who was admitted to [facility] on 03/02/2020 for ongoing management.  1.  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  Suspect that the patient has developed acute tubular necrosis.  He was also exposed to contrast on 03/24/2020.  -Renal function improving.  BUN down to 70 with a creatinine of 2.26.  Continue IV fluid hydration and free water flush.  2.  Anemia of chronic kidney disease.  Hemoglobin currently 8.1.  No indication for transfusion.  Continue to monitor.  3.  Hypernatremia.  Sodium now up.  Currently 147.  Continue to monitor closely.  Hesitant to increase free water flush at the moment.   LOS: 0 Adriana Lina 6/4/202112:42 PM

## 2020-04-24 DIAGNOSIS — N183 Chronic kidney disease, stage 3 unspecified: Secondary | ICD-10-CM | POA: Diagnosis not present

## 2020-04-24 DIAGNOSIS — U071 COVID-19: Secondary | ICD-10-CM | POA: Diagnosis not present

## 2020-04-24 DIAGNOSIS — J9621 Acute and chronic respiratory failure with hypoxia: Secondary | ICD-10-CM | POA: Diagnosis not present

## 2020-04-24 DIAGNOSIS — I482 Chronic atrial fibrillation, unspecified: Secondary | ICD-10-CM | POA: Diagnosis not present

## 2020-04-24 NOTE — Progress Notes (Signed)
Pulmonary Critical Care Medicine [facility] [place]   PULMONARY CRITICAL CARE SERVICE  PROGRESS NOTE  Date of Service: 04/24/2020  Russell Rios  XVQ:[id number]  DOB: Apr 02, 1946   DOA: 03/02/2020  Referring Physician: Carron Curie, MD  HPI: Russell Rios is a 74 y.o. male seen for follow up of Acute on Chronic Respiratory Failure.  Currently is on nasal cannula without distress at this time  Medications: Reviewed on Rounds  Physical Exam:  Vitals: Temperature is 98.6 pulse 96 respiratory 20 blood pressure is 123/79 saturations 95%  Ventilator Settings on nasal cannula off the vent  . General: Comfortable at this time . Eyes: Grossly normal lids, irises & conjunctiva . ENT: grossly tongue is normal . Neck: no obvious mass . Cardiovascular: S1 S2 normal no gallop . Respiratory: No rhonchi no rales are noted . Abdomen: soft . Skin: no rash seen on limited exam . Musculoskeletal: not rigid . Psychiatric:unable to assess . Neurologic: no seizure no involuntary movements         Lab Data:   Basic Metabolic Panel: Recent Labs  Lab 04/19/20 0641 04/21/20 0927 04/23/20 0539  NA 145 147* 147*  K 4.4 4.8 4.3  CL 100 104 107  CO2 36* 36* 30  GLUCOSE 126* 124* 105*  BUN 119* 84* 70*  CREATININE 2.74* 2.62* 2.26*  CALCIUM 9.3 9.7 9.2  PHOS  --  2.4*  --     ABG: No results for input(s): PHART, PCO2ART, PO2ART, HCO3, O2SAT in the last 168 hours.  Liver Function Tests: Recent Labs  Lab 04/21/20 0927  ALBUMIN 2.5*   No results for input(s): LIPASE, AMYLASE in the last 168 hours. No results for input(s): AMMONIA in the last 168 hours.  CBC: Recent Labs  Lab 04/19/20 0641 04/23/20 0539  WBC 7.5 6.6  HGB 8.4* 8.1*  HCT 29.9* 28.6*  MCV 97.1 98.3  PLT 318 256    Cardiac Enzymes: No results for input(s): CKTOTAL, CKMB, CKMBINDEX, TROPONINI in the last 168 hours.  BNP (last 3 results) No results for input(s): BNP in the last 8760 hours.  ProBNP  (last 3 results) No results for input(s): PROBNP in the last 8760 hours.  Radiological Exams: No results found.  Assessment/Plan Active Problems:   Acute on chronic respiratory failure with hypoxia (HCC)   COVID-19 virus infection   Chronic atrial fibrillation with rapid ventricular response (HCC)   Chronic kidney disease, stage III (moderate)   Healthcare-associated pneumonia   1. Acute on chronic respiratory failure hypoxia we will continue with oxygen therapy supportive care. 2. COVID-19 virus infection resolved 3. Chronic atrial fibrillation rate controlled 4. Chronic kidney disease stage III supportive care 5. Healthcare associated pneumonia treated we will follow   I have personally seen and evaluated the patient, evaluated laboratory and imaging results, formulated the assessment and plan and placed orders. The Patient requires high complexity decision making with multiple systems involvement.  Rounds were done with the Respiratory Therapy Director and Staff therapists and discussed with nursing staff also.  Yevonne Pax, MD [facility] Pulmonary Critical Care Medicine Sleep Medicine

## 2020-04-25 ENCOUNTER — Other Ambulatory Visit (HOSPITAL_COMMUNITY): Payer: Medicare Other

## 2020-04-25 DIAGNOSIS — I482 Chronic atrial fibrillation, unspecified: Secondary | ICD-10-CM | POA: Diagnosis not present

## 2020-04-25 DIAGNOSIS — J9621 Acute and chronic respiratory failure with hypoxia: Secondary | ICD-10-CM | POA: Diagnosis not present

## 2020-04-25 DIAGNOSIS — U071 COVID-19: Secondary | ICD-10-CM | POA: Diagnosis not present

## 2020-04-25 DIAGNOSIS — N183 Chronic kidney disease, stage 3 unspecified: Secondary | ICD-10-CM | POA: Diagnosis not present

## 2020-04-25 LAB — BASIC METABOLIC PANEL
Anion gap: 13 (ref 5–15)
BUN: 55 mg/dL — ABNORMAL HIGH (ref 8–23)
CO2: 29 mmol/L (ref 22–32)
Calcium: 9.3 mg/dL (ref 8.9–10.3)
Chloride: 108 mmol/L (ref 98–111)
Creatinine, Ser: 2.03 mg/dL — ABNORMAL HIGH (ref 0.61–1.24)
GFR calc Af Amer: 36 mL/min — ABNORMAL LOW (ref >60)
GFR calc non Af Amer: 31 mL/min — ABNORMAL LOW (ref >60)
Glucose, Bld: 120 mg/dL — ABNORMAL HIGH (ref 70–99)
Potassium: 4.3 mmol/L (ref 3.5–5.1)
Sodium: 150 mmol/L — ABNORMAL HIGH (ref 135–145)

## 2020-04-25 NOTE — Progress Notes (Signed)
Pulmonary Critical Care Medicine [facility] [place]   PULMONARY CRITICAL CARE SERVICE  PROGRESS NOTE  Date of Service: 04/25/2020  Russell Rios  GGE:[id number]  DOB: 1946-04-21   DOA: 03/02/2020  Referring Physician: Carron Curie, MD  HPI: Russell Rios is a 74 y.o. male seen for follow up of Acute on Chronic Respiratory Failure.  Patient is on nasal cannula right now without distress decannulated  Medications: Reviewed on Rounds  Physical Exam:  Vitals: Temperature is 98.2 pulse 100 respiratory rate 35 blood pressure is 138/56 saturations 98%  Ventilator Settings off ventilator nasal cannula  . General: Comfortable at this time . Eyes: Grossly normal lids, irises & conjunctiva . ENT: grossly tongue is normal . Neck: no obvious mass . Cardiovascular: S1 S2 normal no gallop . Respiratory: No rhonchi no rales noted at this time . Abdomen: soft . Skin: no rash seen on limited exam . Musculoskeletal: not rigid . Psychiatric:unable to assess . Neurologic: no seizure no involuntary movements         Lab Data:   Basic Metabolic Panel: Recent Labs  Lab 04/19/20 0641 04/21/20 0927 04/23/20 0539 04/25/20 0657  NA 145 147* 147* 150*  K 4.4 4.8 4.3 4.3  CL 100 104 107 108  CO2 36* 36* 30 29  GLUCOSE 126* 124* 105* 120*  BUN 119* 84* 70* 55*  CREATININE 2.74* 2.62* 2.26* 2.03*  CALCIUM 9.3 9.7 9.2 9.3  PHOS  --  2.4*  --   --     ABG: No results for input(s): PHART, PCO2ART, PO2ART, HCO3, O2SAT in the last 168 hours.  Liver Function Tests: Recent Labs  Lab 04/21/20 0927  ALBUMIN 2.5*   No results for input(s): LIPASE, AMYLASE in the last 168 hours. No results for input(s): AMMONIA in the last 168 hours.  CBC: Recent Labs  Lab 04/19/20 0641 04/23/20 0539  WBC 7.5 6.6  HGB 8.4* 8.1*  HCT 29.9* 28.6*  MCV 97.1 98.3  PLT 318 256    Cardiac Enzymes: No results for input(s): CKTOTAL, CKMB, CKMBINDEX, TROPONINI in the last 168 hours.  BNP (last 3  results) No results for input(s): BNP in the last 8760 hours.  ProBNP (last 3 results) No results for input(s): PROBNP in the last 8760 hours.  Radiological Exams: DG CHEST PORT 1 VIEW  Result Date: 04/25/2020 CLINICAL DATA:  Respiratory failure. EXAM: PORTABLE CHEST 1 VIEW COMPARISON:  04/09/2020 FINDINGS: Tracheostomy tube has been removed. RIGHT IJ central line tip overlies the superior vena cava. Heart is mildly enlarged but stable in configuration. Better lung inflation. There is persistence of fine reticular opacities throughout the lungs bilaterally without new consolidation. There is stable consolidation at the lung apices, LEFT greater than RIGHT. IMPRESSION: 1. Better lung inflation. 2. Persistent interstitial infiltrates. Electronically Signed   By: Norva Pavlov M.D.   On: 04/25/2020 08:00    Assessment/Plan Active Problems:   Acute on chronic respiratory failure with hypoxia (HCC)   COVID-19 virus infection   Chronic atrial fibrillation with rapid ventricular response (HCC)   Chronic kidney disease, stage III (moderate)   Healthcare-associated pneumonia   1. Acute on chronic respiratory failure hypoxia continue with oxygen therapy as tolerated 2. COVID-19 virus infection resolved 3. Chronic atrial fibrillation rate controlled 4. Chronic kidney disease stage III followed by nephrology 5. Healthcare associated pneumonia clinically improving there is some infiltrate still persist   I have personally seen and evaluated the patient, evaluated laboratory and imaging results, formulated the assessment and  plan and placed orders. The Patient requires high complexity decision making with multiple systems involvement.  Rounds were done with the Respiratory Therapy Director and Staff therapists and discussed with nursing staff also.  Allyne Gee, MD [facility] Pulmonary Critical Care Medicine Sleep Medicine

## 2020-04-26 ENCOUNTER — Other Ambulatory Visit (HOSPITAL_COMMUNITY): Payer: Medicare Other

## 2020-04-26 DIAGNOSIS — J9621 Acute and chronic respiratory failure with hypoxia: Secondary | ICD-10-CM | POA: Diagnosis not present

## 2020-04-26 DIAGNOSIS — U071 COVID-19: Secondary | ICD-10-CM | POA: Diagnosis not present

## 2020-04-26 DIAGNOSIS — I482 Chronic atrial fibrillation, unspecified: Secondary | ICD-10-CM | POA: Diagnosis not present

## 2020-04-26 DIAGNOSIS — N183 Chronic kidney disease, stage 3 unspecified: Secondary | ICD-10-CM | POA: Diagnosis not present

## 2020-04-26 NOTE — Progress Notes (Signed)
[facility] [place] [facility]  ROUNDING NOTE   Subjective:  Patient seen and evaluated at bedside. BUN down to 55 with a creatinine of 2.03. Serum sodium still high at 150. Patient on D5W.  Objective:  Vital signs in last 24 hours:  Temperature 98 pulse 86 respirations 18 blood pressure 129/67   Physical Exam: General: No acute distress  Head: Normocephalic, atraumatic. Dry oral mucosal membranes  Eyes: Anicteric  Neck: Tracheostomy in place  Lungs:  Scattered rhonchi, normal effort  Heart: S1S2 no rubs  Abdomen:  Soft, nontender, bowel sounds present  Extremities: Trace peripheral edema.  Neurologic: Awake, alert, following commands  Skin: No rash       Basic Metabolic Panel: Recent Labs  Lab 04/21/20 0927 04/23/20 0539 04/25/20 0657  NA 147* 147* 150*  K 4.8 4.3 4.3  CL 104 107 108  CO2 36* 30 29  GLUCOSE 124* 105* 120*  BUN 84* 70* 55*  CREATININE 2.62* 2.26* 2.03*  CALCIUM 9.7 9.2 9.3  PHOS 2.4*  --   --     Liver Function Tests: Recent Labs  Lab 04/21/20 0927  ALBUMIN 2.5*   No results for input(s): LIPASE, AMYLASE in the last 168 hours. No results for input(s): AMMONIA in the last 168 hours.  CBC: Recent Labs  Lab 04/23/20 0539  WBC 6.6  HGB 8.1*  HCT 28.6*  MCV 98.3  PLT 256    Cardiac Enzymes: No results for input(s): CKTOTAL, CKMB, CKMBINDEX, TROPONINI in the last 168 hours.  BNP: Invalid input(s): POCBNP  CBG: No results for input(s): GLUCAP in the last 168 hours.  Microbiology: Results for orders placed or performed during the hospital encounter of 03/02/20  Culture, respiratory     Status: None   Collection Time: 03/19/20  1:35 PM   Specimen: Tracheal Aspirate  Result Value Ref Range Status   Specimen Description TRACHEAL ASPIRATE  Final   Special Requests NONE  Final   Gram Stain   Final    NO WBC SEEN FEW SQUAMOUS EPITHELIAL CELLS PRESENT NO ORGANISMS SEEN    Culture   Final    ABUNDANT PSEUDOMONAS AERUGINOSA SEE  SEPARATE REPORT FOR ADDITIONAL SUSCEPTIBILITIES Performed at [employer] Performed at [facility], [place] [address], [place], [place] [address]    Report Status 04/10/2020 FINAL  Final   Organism ID, Bacteria PSEUDOMONAS AERUGINOSA  Final      Susceptibility   Pseudomonas aeruginosa - MIC*    CEFTAZIDIME >=64 RESISTANT Resistant     CIPROFLOXACIN >=4 RESISTANT Resistant     GENTAMICIN 4 SENSITIVE Sensitive     IMIPENEM >=16 RESISTANT Resistant     CEFEPIME >=64 RESISTANT Resistant     * ABUNDANT PSEUDOMONAS AERUGINOSA  Culture, respiratory (non-expectorated)     Status: None   Collection Time: 03/20/20 10:17 AM   Specimen: Tracheal Aspirate; Respiratory  Result Value Ref Range Status   Specimen Description TRACHEAL ASPIRATE  Final   Special Requests NONE  Final   Gram Stain   Final    NO WBC SEEN RARE GRAM POSITIVE COCCI IN PAIRS FEW GRAM VARIABLE ROD Performed at [facility], [place] [address] [place], [place] [address]    Culture ABUNDANT PSEUDOMONAS AERUGINOSA  Final   Report Status 03/22/2020 FINAL  Final   Organism ID, Bacteria PSEUDOMONAS AERUGINOSA  Final      Susceptibility   Pseudomonas aeruginosa - MIC*    CEFTAZIDIME >=64 RESISTANT Resistant     CIPROFLOXACIN 2 INTERMEDIATE  Intermediate     GENTAMICIN 8 INTERMEDIATE Intermediate     IMIPENEM >=16 RESISTANT Resistant     CEFEPIME >=64 RESISTANT Resistant     * ABUNDANT PSEUDOMONAS AERUGINOSA  Culture, Urine     Status: None   Collection Time: 04/20/20 12:10 PM   Specimen: Urine, Clean Catch  Result Value Ref Range Status   Specimen Description URINE, CLEAN CATCH  Final   Special Requests NONE  Final   Culture   Final    NO GROWTH Performed at [facility], [address] [place], [place] [address]    Report Status 04/21/2020 FINAL  Final    Coagulation Studies: No results for input(s): LABPROT, INR in the last 72 hours.  Urinalysis: No results for input(s): COLORURINE, LABSPEC,  PHURINE, GLUCOSEU, HGBUR, BILIRUBINUR, KETONESUR, PROTEINUR, UROBILINOGEN, NITRITE, LEUKOCYTESUR in the last 72 hours.  Invalid input(s): APPERANCEUR    Imaging: DG CHEST PORT 1 VIEW  Result Date: 04/25/2020 CLINICAL DATA:  Respiratory failure. EXAM: PORTABLE CHEST 1 VIEW COMPARISON:  04/09/2020 FINDINGS: Tracheostomy tube has been removed. RIGHT IJ central line tip overlies the superior vena cava. Heart is mildly enlarged but stable in configuration. Better lung inflation. There is persistence of fine reticular opacities throughout the lungs bilaterally without new consolidation. There is stable consolidation at the lung apices, LEFT greater than RIGHT. IMPRESSION: 1. Better lung inflation. 2. Persistent interstitial infiltrates. Electronically Signed   By: Norva Pavlov M.D.   On: 04/25/2020 08:00     Medications:     iohexol  Assessment/ Plan:  74 y.o. male with a PMHx of abdominal aortic aneurysm, right hepatic lobe lesion, cystic mass left kidney, BPH, history of tobacco abuse, hypertension, hyperlipidemia, COVID-19 infection,, acute respiratory failure secondary to COVID-19 infection and Pseudomonas who was admitted to [facility] on 03/02/2020 for ongoing management.  1.  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  Suspect that the patient has developed acute tubular necrosis.  He was also exposed to contrast on 03/24/2020.  -Kidney function continues to improve slowly.  BUN down to 55 with a creatinine of 2.03.  Continue IV fluid hydration.  2.  Anemia of chronic kidney disease.  Hemoglobin was 8.1 at last check.  No indication for transfusion.  3.  Hypernatremia.  Serum sodium up to 150.  Agree with D5W at 75 cc/h.  LOS: 0 Keesha Pellum 6/7/20218:39 AM

## 2020-04-26 NOTE — Progress Notes (Signed)
Pulmonary Critical Care Medicine [facility] [place]   PULMONARY CRITICAL CARE SERVICE  PROGRESS NOTE  Date of Service: 04/26/2020  Russell Rios  KVQ:[id number]  DOB: [birth year]   DOA: 03/02/2020  Referring Physician: Carron Curie, MD  HPI: Russell Rios is a 74 y.o. male seen for follow up of Acute on Chronic Respiratory Failure.  Patient currently is on 2 L good saturations  Medications: Reviewed on Rounds  Physical Exam:  Vitals: Temperature is 98.0 pulse 86 respiratory 18 blood pressure is 129/67 saturations 100%  Ventilator Settings on 2 L oxygen  . General: Comfortable at this time . Eyes: Grossly normal lids, irises & conjunctiva . ENT: grossly tongue is normal . Neck: no obvious mass . Cardiovascular: S1 S2 normal no gallop . Respiratory: No rhonchi no rales are noted at this time . Abdomen: soft . Skin: no rash seen on limited exam . Musculoskeletal: not rigid . Psychiatric:unable to assess . Neurologic: no seizure no involuntary movements         Lab Data:   Basic Metabolic Panel: Recent Labs  Lab 04/21/20 0927 04/23/20 0539 04/25/20 0657  NA 147* 147* 150*  K 4.8 4.3 4.3  CL 104 107 108  CO2 36* 30 29  GLUCOSE 124* 105* 120*  BUN 84* 70* 55*  CREATININE 2.62* 2.26* 2.03*  CALCIUM 9.7 9.2 9.3  PHOS 2.4*  --   --     ABG: No results for input(s): PHART, PCO2ART, PO2ART, HCO3, O2SAT in the last 168 hours.  Liver Function Tests: Recent Labs  Lab 04/21/20 0927  ALBUMIN 2.5*   No results for input(s): LIPASE, AMYLASE in the last 168 hours. No results for input(s): AMMONIA in the last 168 hours.  CBC: Recent Labs  Lab 04/23/20 0539  WBC 6.6  HGB 8.1*  HCT 28.6*  MCV 98.3  PLT 256    Cardiac Enzymes: No results for input(s): CKTOTAL, CKMB, CKMBINDEX, TROPONINI in the last 168 hours.  BNP (last 3 results) No results for input(s): BNP in the last 8760 hours.  ProBNP (last 3 results) No results for input(s): PROBNP in the  last 8760 hours.  Radiological Exams: DG CHEST PORT 1 VIEW  Result Date: 04/25/2020 CLINICAL DATA:  Respiratory failure. EXAM: PORTABLE CHEST 1 VIEW COMPARISON:  04/09/2020 FINDINGS: Tracheostomy tube has been removed. RIGHT IJ central line tip overlies the superior vena cava. Heart is mildly enlarged but stable in configuration. Better lung inflation. There is persistence of fine reticular opacities throughout the lungs bilaterally without new consolidation. There is stable consolidation at the lung apices, LEFT greater than RIGHT. IMPRESSION: 1. Better lung inflation. 2. Persistent interstitial infiltrates. Electronically Signed   By: Norva Pavlov M.D.   On: 04/25/2020 08:00    Assessment/Plan Active Problems:   Acute on chronic respiratory failure with hypoxia (HCC)   COVID-19 virus infection   Chronic atrial fibrillation with rapid ventricular response (HCC)   Chronic kidney disease, stage III (moderate)   Healthcare-associated pneumonia   1. Acute on chronic respiratory failure hypoxia we will continue with oxygen titrate as tolerated 2. COVID-19 virus infection resolved 3. Chronic atrial fibrillation rate controlled 4. Chronic kidney disease stage III followed by nephrology 5. Healthcare associated pneumonia we will continue with supportive care   I have personally seen and evaluated the patient, evaluated laboratory and imaging results, formulated the assessment and plan and placed orders. The Patient requires high complexity decision making with multiple systems involvement.  Rounds were done with the Respiratory  Therapy Director and Staff therapists and discussed with nursing staff also.  Allyne Gee, MD [facility] Pulmonary Critical Care Medicine Sleep Medicine

## 2020-04-27 DIAGNOSIS — J9621 Acute and chronic respiratory failure with hypoxia: Secondary | ICD-10-CM | POA: Diagnosis not present

## 2020-04-27 DIAGNOSIS — U071 COVID-19: Secondary | ICD-10-CM | POA: Diagnosis not present

## 2020-04-27 DIAGNOSIS — I482 Chronic atrial fibrillation, unspecified: Secondary | ICD-10-CM | POA: Diagnosis not present

## 2020-04-27 DIAGNOSIS — N183 Chronic kidney disease, stage 3 unspecified: Secondary | ICD-10-CM | POA: Diagnosis not present

## 2020-04-27 NOTE — Progress Notes (Signed)
[facility]  ROUNDING NOTE   Subjective:  Patient resting comfortably in bed. No new sodium today. Appears more awake and alert today.  Objective:  Vital signs in last 24 hours:  Temperature 9 9 pulse 86 respirations 18 blood pressure 128/71   Physical Exam: General: No acute distress  Head: Normocephalic, atraumatic. Dry oral mucosal membranes  Eyes: Anicteric  Neck: Tracheostomy in place  Lungs:  Scattered rhonchi, normal effort  Heart: S1S2 no rubs  Abdomen:  Soft, nontender, bowel sounds present  Extremities: Trace peripheral edema.  Neurologic: Awake, alert, following commands  Skin: No rash       Basic Metabolic Panel: Recent Labs  Lab 04/21/20 0927 04/23/20 0539 04/25/20 0657  NA 147* 147* 150*  K 4.8 4.3 4.3  CL 104 107 108  CO2 36* 30 29  GLUCOSE 124* 105* 120*  BUN 84* 70* 55*  CREATININE 2.62* 2.26* 2.03*  CALCIUM 9.7 9.2 9.3  PHOS 2.4*  --   --     Liver Function Tests: Recent Labs  Lab 04/21/20 0927  ALBUMIN 2.5*   No results for input(s): LIPASE, AMYLASE in the last 168 hours. No results for input(s): AMMONIA in the last 168 hours.  CBC: Recent Labs  Lab 04/23/20 0539  WBC 6.6  HGB 8.1*  HCT 28.6*  MCV 98.3  PLT 256    Cardiac Enzymes: No results for input(s): CKTOTAL, CKMB, CKMBINDEX, TROPONINI in the last 168 hours.  BNP: Invalid input(s): POCBNP  CBG: No results for input(s): GLUCAP in the last 168 hours.  Microbiology: Results for orders placed or performed during the hospital encounter of 03/02/20  Culture, respiratory     Status: None   Collection Time: 03/19/20  1:35 PM   Specimen: Tracheal Aspirate  Result Value Ref Range Status   Specimen Description TRACHEAL ASPIRATE  Final   Special Requests NONE  Final   Gram Stain   Final    NO WBC SEEN FEW SQUAMOUS EPITHELIAL CELLS PRESENT NO ORGANISMS SEEN    Culture   Final    ABUNDANT PSEUDOMONAS AERUGINOSA SEE SEPARATE REPORT FOR ADDITIONAL  SUSCEPTIBILITIES Performed at [employer] Performed at [facility], [address] [place], [place] [address]    Report Status 04/10/2020 FINAL  Final   Organism ID, Bacteria PSEUDOMONAS AERUGINOSA  Final      Susceptibility   Pseudomonas aeruginosa - MIC*    CEFTAZIDIME >=64 RESISTANT Resistant     CIPROFLOXACIN >=4 RESISTANT Resistant     GENTAMICIN 4 SENSITIVE Sensitive     IMIPENEM >=16 RESISTANT Resistant     CEFEPIME >=64 RESISTANT Resistant     * ABUNDANT PSEUDOMONAS AERUGINOSA  Culture, respiratory (non-expectorated)     Status: None   Collection Time: 03/20/20 10:17 AM   Specimen: Tracheal Aspirate; Respiratory  Result Value Ref Range Status   Specimen Description TRACHEAL ASPIRATE  Final   Special Requests NONE  Final   Gram Stain   Final    NO WBC SEEN RARE GRAM POSITIVE COCCI IN PAIRS FEW GRAM VARIABLE ROD Performed at [facility], [address], [place], [place] [address]    Culture ABUNDANT PSEUDOMONAS AERUGINOSA  Final   Report Status 03/22/2020 FINAL  Final   Organism ID, Bacteria PSEUDOMONAS AERUGINOSA  Final      Susceptibility   Pseudomonas aeruginosa - MIC*    CEFTAZIDIME >=64 RESISTANT Resistant     CIPROFLOXACIN 2 INTERMEDIATE Intermediate     GENTAMICIN 8 INTERMEDIATE  Intermediate     IMIPENEM >=16 RESISTANT Resistant     CEFEPIME >=64 RESISTANT Resistant     * ABUNDANT PSEUDOMONAS AERUGINOSA  Culture, Urine     Status: None   Collection Time: 04/20/20 12:10 PM   Specimen: Urine, Clean Catch  Result Value Ref Range Status   Specimen Description URINE, CLEAN CATCH  Final   Special Requests NONE  Final   Culture   Final    NO GROWTH Performed at [facility], [address], [place], [place] [address]    Report Status 04/21/2020 FINAL  Final    Coagulation Studies: No results for input(s): LABPROT, INR in the last 72 hours.  Urinalysis: No results for input(s): COLORURINE, LABSPEC, PHURINE, GLUCOSEU, HGBUR,  BILIRUBINUR, KETONESUR, PROTEINUR, UROBILINOGEN, NITRITE, LEUKOCYTESUR in the last 72 hours.  Invalid input(s): APPERANCEUR    Imaging: No results found.   Medications:     iohexol  Assessment/ Plan:  74 y.o. male with a PMHx of abdominal aortic aneurysm, right hepatic lobe lesion, cystic mass left kidney, BPH, history of tobacco abuse, hypertension, hyperlipidemia, COVID-19 infection, acute respiratory failure secondary to COVID-19 infection and Pseudomonas who was admitted to [facility] on 03/02/2020 for ongoing management.  1.  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  Suspect that the patient has developed acute tubular necrosis.  He was also exposed to contrast on 03/24/2020.  -Recommend repeating renal function tomorrow.  BUN and creatinine have been trending down.  2.  Anemia of chronic kidney disease.  Continue to periodically monitor CBC.  Hold off on Epogen for now.  3.  Hypernatremia.  Patient has been on D5W given high sodium.  Repeat serum sodium tomorrow.   LOS: 0 Maisey Deandrade 6/8/202110:10 AM

## 2020-04-27 NOTE — Progress Notes (Signed)
Pulmonary Critical Care Medicine [facility] [place]   PULMONARY CRITICAL CARE SERVICE  PROGRESS NOTE  Date of Service: 04/27/2020  Russell Rios  HMC:[id number]  DOB: [birth year]   DOA: 03/02/2020  Referring Physician: Carron Curie, MD  HPI: Russell Rios is a 74 y.o. male seen for follow up of Acute on Chronic Respiratory Failure.  Comfortable right now without distress patient is on 1 L O2 good saturations  Medications: Reviewed on Rounds  Physical Exam:  Vitals: Temperature is 99.0 pulse 86 respiratory 18 blood pressure is 128/81 saturations 99%  Ventilator Settings on 1 L oxygen  . General: Comfortable at this time . Eyes: Grossly normal lids, irises & conjunctiva . ENT: grossly tongue is normal . Neck: no obvious mass . Cardiovascular: S1 S2 normal no gallop . Respiratory: No rhonchi no rales are noted at this time . Abdomen: soft . Skin: no rash seen on limited exam . Musculoskeletal: not rigid . Psychiatric:unable to assess . Neurologic: no seizure no involuntary movements         Lab Data:   Basic Metabolic Panel: Recent Labs  Lab 04/21/20 0927 04/23/20 0539 04/25/20 0657  NA 147* 147* 150*  K 4.8 4.3 4.3  CL 104 107 108  CO2 36* 30 29  GLUCOSE 124* 105* 120*  BUN 84* 70* 55*  CREATININE 2.62* 2.26* 2.03*  CALCIUM 9.7 9.2 9.3  PHOS 2.4*  --   --     ABG: No results for input(s): PHART, PCO2ART, PO2ART, HCO3, O2SAT in the last 168 hours.  Liver Function Tests: Recent Labs  Lab 04/21/20 0927  ALBUMIN 2.5*   No results for input(s): LIPASE, AMYLASE in the last 168 hours. No results for input(s): AMMONIA in the last 168 hours.  CBC: Recent Labs  Lab 04/23/20 0539  WBC 6.6  HGB 8.1*  HCT 28.6*  MCV 98.3  PLT 256    Cardiac Enzymes: No results for input(s): CKTOTAL, CKMB, CKMBINDEX, TROPONINI in the last 168 hours.  BNP (last 3 results) No results for input(s): BNP in the last 8760 hours.  ProBNP (last 3 results) No results  for input(s): PROBNP in the last 8760 hours.  Radiological Exams: No results found.  Assessment/Plan Active Problems:   Acute on chronic respiratory failure with hypoxia (HCC)   COVID-19 virus infection   Chronic atrial fibrillation with rapid ventricular response (HCC)   Chronic kidney disease, stage III (moderate)   Healthcare-associated pneumonia   1. Acute on chronic respiratory failure hypoxia we will continue with oxygen therapy supportive care titrate as tolerated 2. Chronic kidney disease being followed by nephrology will continue with supportive care 3. COVID-19 virus infection resolved 4. Chronic atrial fibrillation rate is controlled 5. Healthcare associated pneumonia treated improved   I have personally seen and evaluated the patient, evaluated laboratory and imaging results, formulated the assessment and plan and placed orders. The Patient requires high complexity decision making with multiple systems involvement.  Rounds were done with the Respiratory Therapy Director and Staff therapists and discussed with nursing staff also.  Yevonne Pax, MD [facility] Pulmonary Critical Care Medicine Sleep Medicine

## 2020-04-28 DIAGNOSIS — N183 Chronic kidney disease, stage 3 unspecified: Secondary | ICD-10-CM | POA: Diagnosis not present

## 2020-04-28 DIAGNOSIS — U071 COVID-19: Secondary | ICD-10-CM | POA: Diagnosis not present

## 2020-04-28 DIAGNOSIS — J9621 Acute and chronic respiratory failure with hypoxia: Secondary | ICD-10-CM | POA: Diagnosis not present

## 2020-04-28 DIAGNOSIS — I482 Chronic atrial fibrillation, unspecified: Secondary | ICD-10-CM | POA: Diagnosis not present

## 2020-04-28 LAB — RENAL FUNCTION PANEL
Albumin: 2.3 g/dL — ABNORMAL LOW (ref 3.5–5.0)
Anion gap: 10 (ref 5–15)
BUN: 60 mg/dL — ABNORMAL HIGH (ref 8–23)
CO2: 31 mmol/L (ref 22–32)
Calcium: 9.1 mg/dL (ref 8.9–10.3)
Chloride: 105 mmol/L (ref 98–111)
Creatinine, Ser: 2.19 mg/dL — ABNORMAL HIGH (ref 0.61–1.24)
GFR calc Af Amer: 33 mL/min — ABNORMAL LOW (ref >60)
GFR calc non Af Amer: 29 mL/min — ABNORMAL LOW (ref >60)
Glucose, Bld: 122 mg/dL — ABNORMAL HIGH (ref 70–99)
Phosphorus: 3.3 mg/dL (ref 2.5–4.6)
Potassium: 3.8 mmol/L (ref 3.5–5.1)
Sodium: 146 mmol/L — ABNORMAL HIGH (ref 135–145)

## 2020-04-28 LAB — CBC
HCT: 31.4 % — ABNORMAL LOW (ref 39.0–52.0)
Hemoglobin: 9 g/dL — ABNORMAL LOW (ref 13.0–17.0)
MCH: 27.4 pg (ref 26.0–34.0)
MCHC: 28.7 g/dL — ABNORMAL LOW (ref 30.0–36.0)
MCV: 95.7 fL (ref 80.0–100.0)
Platelets: 245 10*3/uL (ref 150–400)
RBC: 3.28 MIL/uL — ABNORMAL LOW (ref 4.22–5.81)
RDW: 21.4 % — ABNORMAL HIGH (ref 11.5–15.5)
WBC: 9 10*3/uL (ref 4.0–10.5)
nRBC: 0 % (ref 0.0–0.2)

## 2020-04-28 LAB — BASIC METABOLIC PANEL
Anion gap: 12 (ref 5–15)
BUN: 64 mg/dL — ABNORMAL HIGH (ref 8–23)
CO2: 28 mmol/L (ref 22–32)
Calcium: 9.1 mg/dL (ref 8.9–10.3)
Chloride: 104 mmol/L (ref 98–111)
Creatinine, Ser: 2.2 mg/dL — ABNORMAL HIGH (ref 0.61–1.24)
GFR calc Af Amer: 33 mL/min — ABNORMAL LOW (ref >60)
GFR calc non Af Amer: 28 mL/min — ABNORMAL LOW (ref >60)
Glucose, Bld: 125 mg/dL — ABNORMAL HIGH (ref 70–99)
Potassium: 3.8 mmol/L (ref 3.5–5.1)
Sodium: 144 mmol/L (ref 135–145)

## 2020-04-28 NOTE — Progress Notes (Signed)
Pulmonary Critical Care Medicine [facility] [place]   PULMONARY CRITICAL CARE SERVICE  PROGRESS NOTE  Date of Service: 04/28/2020  Russell Rios  JOA:[id number]  DOB: Mar 18, 1946   DOA: 03/02/2020  Referring Physician: Carron Curie, MD  HPI: Russell Rios is a 74 y.o. male seen for follow up of Acute on Chronic Respiratory Failure.  Currently off the ventilator right now is on 1 L oxygen has been on nasal cannula  Medications: Reviewed on Rounds  Physical Exam:  Vitals: Temperature is 99.5 pulse 85 respiratory 23 blood pressure is 116/56 saturations 99%  Ventilator Settings on nasal cannula 1 L O2  . General: Comfortable at this time . Eyes: Grossly normal lids, irises & conjunctiva . ENT: grossly tongue is normal . Neck: no obvious mass . Cardiovascular: S1 S2 normal no gallop . Respiratory: No rhonchi coarse breath sounds . Abdomen: soft . Skin: no rash seen on limited exam . Musculoskeletal: not rigid . Psychiatric:unable to assess . Neurologic: no seizure no involuntary movements         Lab Data:   Basic Metabolic Panel: Recent Labs  Lab 04/23/20 0539 04/25/20 0657 04/28/20 0540 04/28/20 1022  NA 147* 150* 146* 144  K 4.3 4.3 3.8 3.8  CL 107 108 105 104  CO2 30 29 31 28   GLUCOSE 105* 120* 122* 125*  BUN 70* 55* 60* 64*  CREATININE 2.26* 2.03* 2.19* 2.20*  CALCIUM 9.2 9.3 9.1 9.1  PHOS  --   --  3.3  --     ABG: No results for input(s): PHART, PCO2ART, PO2ART, HCO3, O2SAT in the last 168 hours.  Liver Function Tests: Recent Labs  Lab 04/28/20 0540  ALBUMIN 2.3*   No results for input(s): LIPASE, AMYLASE in the last 168 hours. No results for input(s): AMMONIA in the last 168 hours.  CBC: Recent Labs  Lab 04/23/20 0539 04/28/20 1022  WBC 6.6 9.0  HGB 8.1* 9.0*  HCT 28.6* 31.4*  MCV 98.3 95.7  PLT 256 245    Cardiac Enzymes: No results for input(s): CKTOTAL, CKMB, CKMBINDEX, TROPONINI in the last 168 hours.  BNP (last 3  results) No results for input(s): BNP in the last 8760 hours.  ProBNP (last 3 results) No results for input(s): PROBNP in the last 8760 hours.  Radiological Exams: No results found.  Assessment/Plan Active Problems:   Acute on chronic respiratory failure with hypoxia (HCC)   COVID-19 virus infection   Chronic atrial fibrillation with rapid ventricular response (HCC)   Chronic kidney disease, stage III (moderate)   Healthcare-associated pneumonia   1. Acute on chronic respiratory failure with hypoxia plan is to continue with oxygen as tolerated awaiting discharge planning 2. COVID-19 virus infection resolved we will continue with supportive care 3. Chronic atrial fibrillation rate controlled 4. Chronic kidney disease stage III patient is being followed by nephrology 5. Healthcare associated pneumonia treated clinically appears to be improving   I have personally seen and evaluated the patient, evaluated laboratory and imaging results, formulated the assessment and plan and placed orders. The Patient requires high complexity decision making with multiple systems involvement.  Rounds were done with the Respiratory Therapy Director and Staff therapists and discussed with nursing staff also.  06/28/20, MD [facility] Pulmonary Critical Care Medicine Sleep Medicine

## 2020-04-29 LAB — SARS CORONAVIRUS 2 (TAT 6-24 HRS): SARS Coronavirus 2: NEGATIVE

## 2020-04-30 LAB — BASIC METABOLIC PANEL
Anion gap: 8 (ref 5–15)
BUN: 60 mg/dL — ABNORMAL HIGH (ref 8–23)
CO2: 30 mmol/L (ref 22–32)
Calcium: 8.8 mg/dL — ABNORMAL LOW (ref 8.9–10.3)
Chloride: 105 mmol/L (ref 98–111)
Creatinine, Ser: 2.04 mg/dL — ABNORMAL HIGH (ref 0.61–1.24)
GFR calc Af Amer: 36 mL/min — ABNORMAL LOW (ref >60)
GFR calc non Af Amer: 31 mL/min — ABNORMAL LOW (ref >60)
Glucose, Bld: 108 mg/dL — ABNORMAL HIGH (ref 70–99)
Potassium: 3.9 mmol/L (ref 3.5–5.1)
Sodium: 143 mmol/L (ref 135–145)

## 2020-04-30 LAB — CBC
HCT: 30.3 % — ABNORMAL LOW (ref 39.0–52.0)
Hemoglobin: 8.8 g/dL — ABNORMAL LOW (ref 13.0–17.0)
MCH: 27.7 pg (ref 26.0–34.0)
MCHC: 29 g/dL — ABNORMAL LOW (ref 30.0–36.0)
MCV: 95.3 fL (ref 80.0–100.0)
Platelets: 243 10*3/uL (ref 150–400)
RBC: 3.18 MIL/uL — ABNORMAL LOW (ref 4.22–5.81)
RDW: 20.9 % — ABNORMAL HIGH (ref 11.5–15.5)
WBC: 7.6 10*3/uL (ref 4.0–10.5)
nRBC: 0.3 % — ABNORMAL HIGH (ref 0.0–0.2)

## 2020-04-30 NOTE — Progress Notes (Signed)
Entered in Error

## 2020-05-02 ENCOUNTER — Other Ambulatory Visit (HOSPITAL_COMMUNITY): Payer: Medicare Other

## 2020-05-02 LAB — BASIC METABOLIC PANEL
Anion gap: 11 (ref 5–15)
BUN: 75 mg/dL — ABNORMAL HIGH (ref 8–23)
CO2: 29 mmol/L (ref 22–32)
Calcium: 9.3 mg/dL (ref 8.9–10.3)
Chloride: 110 mmol/L (ref 98–111)
Creatinine, Ser: 2.14 mg/dL — ABNORMAL HIGH (ref 0.61–1.24)
GFR calc Af Amer: 34 mL/min — ABNORMAL LOW (ref >60)
GFR calc non Af Amer: 29 mL/min — ABNORMAL LOW (ref >60)
Glucose, Bld: 110 mg/dL — ABNORMAL HIGH (ref 70–99)
Potassium: 4.6 mmol/L (ref 3.5–5.1)
Sodium: 150 mmol/L — ABNORMAL HIGH (ref 135–145)

## 2020-05-02 LAB — CBC
HCT: 31.2 % — ABNORMAL LOW (ref 39.0–52.0)
Hemoglobin: 9 g/dL — ABNORMAL LOW (ref 13.0–17.0)
MCH: 27.6 pg (ref 26.0–34.0)
MCHC: 28.8 g/dL — ABNORMAL LOW (ref 30.0–36.0)
MCV: 95.7 fL (ref 80.0–100.0)
Platelets: 286 10*3/uL (ref 150–400)
RBC: 3.26 MIL/uL — ABNORMAL LOW (ref 4.22–5.81)
RDW: 20.9 % — ABNORMAL HIGH (ref 11.5–15.5)
WBC: 7.9 10*3/uL (ref 4.0–10.5)
nRBC: 0 % (ref 0.0–0.2)

## 2020-05-03 LAB — BASIC METABOLIC PANEL
Anion gap: 12 (ref 5–15)
BUN: 76 mg/dL — ABNORMAL HIGH (ref 8–23)
CO2: 28 mmol/L (ref 22–32)
Calcium: 9.3 mg/dL (ref 8.9–10.3)
Chloride: 108 mmol/L (ref 98–111)
Creatinine, Ser: 2.22 mg/dL — ABNORMAL HIGH (ref 0.61–1.24)
GFR calc Af Amer: 33 mL/min — ABNORMAL LOW (ref >60)
GFR calc non Af Amer: 28 mL/min — ABNORMAL LOW (ref >60)
Glucose, Bld: 140 mg/dL — ABNORMAL HIGH (ref 70–99)
Potassium: 4.3 mmol/L (ref 3.5–5.1)
Sodium: 148 mmol/L — ABNORMAL HIGH (ref 135–145)

## 2020-05-03 NOTE — Progress Notes (Signed)
[facility]  ROUNDING NOTE   Subjective:  Pt seen at bedside.  Renal function continues to improve. Still has hypernatremia.    Objective:  Vital signs in last 24 hours:  Temperature 97.8 pulse 78 respirations 23 blood pressure 148/66   Physical Exam: General: No acute distress  Head: Normocephalic, atraumatic. Dry oral mucosal membranes  Eyes: Anicteric  Neck: Tracheostomy in place  Lungs:  Scattered rhonchi, normal effort  Heart: S1S2 no rubs  Abdomen:  Soft, nontender, bowel sounds present  Extremities: Trace peripheral edema.  Neurologic: Awake, alert, following commands  Skin: No rash       Basic Metabolic Panel: Recent Labs  Lab 04/28/20 0540 04/28/20 0540 04/28/20 1022 04/30/20 0513 05/02/20 1143  NA 146*  --  144 143 150*  K 3.8  --  3.8 3.9 4.6  CL 105  --  104 105 110  CO2 31  --  28 30 29   GLUCOSE 122*  --  125* 108* 110*  BUN 60*  --  64* 60* 75*  CREATININE 2.19*  --  2.20* 2.04* 2.14*  CALCIUM 9.1   < > 9.1 8.8* 9.3  PHOS 3.3  --   --   --   --    < > = values in this interval not displayed.    Liver Function Tests: Recent Labs  Lab 04/28/20 0540  ALBUMIN 2.3*   No results for input(s): LIPASE, AMYLASE in the last 168 hours. No results for input(s): AMMONIA in the last 168 hours.  CBC: Recent Labs  Lab 04/28/20 1022 04/30/20 0513 05/02/20 1143  WBC 9.0 7.6 7.9  HGB 9.0* 8.8* 9.0*  HCT 31.4* 30.3* 31.2*  MCV 95.7 95.3 95.7  PLT 245 243 286    Cardiac Enzymes: No results for input(s): CKTOTAL, CKMB, CKMBINDEX, TROPONINI in the last 168 hours.  BNP: Invalid input(s): POCBNP  CBG: No results for input(s): GLUCAP in the last 168 hours.  Microbiology: Results for orders placed or performed during the hospital encounter of 03/02/20  Culture, respiratory     Status: None   Collection Time: 03/19/20  1:35 PM   Specimen: Tracheal Aspirate  Result Value Ref Range Status   Specimen Description TRACHEAL ASPIRATE   Final   Special Requests NONE  Final   Gram Stain   Final    NO WBC SEEN FEW SQUAMOUS EPITHELIAL CELLS PRESENT NO ORGANISMS SEEN    Culture   Final    ABUNDANT PSEUDOMONAS AERUGINOSA SEE SEPARATE REPORT FOR ADDITIONAL SUSCEPTIBILITIES Performed at 03/21/20 Performed at [facility], [address], [place], [place]    Report Status 04/10/2020 FINAL  Final   Organism ID, Bacteria PSEUDOMONAS AERUGINOSA  Final      Susceptibility   Pseudomonas aeruginosa - MIC*    CEFTAZIDIME >=64 RESISTANT Resistant     CIPROFLOXACIN >=4 RESISTANT Resistant     GENTAMICIN 4 SENSITIVE Sensitive     IMIPENEM >=16 RESISTANT Resistant     CEFEPIME >=64 RESISTANT Resistant     * ABUNDANT PSEUDOMONAS AERUGINOSA  Culture, respiratory (non-expectorated)     Status: None   Collection Time: 03/20/20 10:17 AM   Specimen: Tracheal Aspirate; Respiratory  Result Value Ref Range Status   Specimen Description TRACHEAL ASPIRATE  Final   Special Requests NONE  Final   Gram Stain   Final    NO WBC SEEN RARE GRAM POSITIVE COCCI IN PAIRS FEW GRAM VARIABLE ROD Performed at [facility],  [address], [place], [place] [address]    Culture ABUNDANT PSEUDOMONAS AERUGINOSA  Final   Report Status 03/22/2020 FINAL  Final   Organism ID, Bacteria PSEUDOMONAS AERUGINOSA  Final      Susceptibility   Pseudomonas aeruginosa - MIC*    CEFTAZIDIME >=64 RESISTANT Resistant     CIPROFLOXACIN 2 INTERMEDIATE Intermediate     GENTAMICIN 8 INTERMEDIATE Intermediate     IMIPENEM >=16 RESISTANT Resistant     CEFEPIME >=64 RESISTANT Resistant     * ABUNDANT PSEUDOMONAS AERUGINOSA  Culture, Urine     Status: None   Collection Time: 04/20/20 12:10 PM   Specimen: Urine, Clean Catch  Result Value Ref Range Status   Specimen Description URINE, CLEAN CATCH  Final   Special Requests NONE  Final   Culture   Final    NO GROWTH Performed at [facility], [place] [address], [place], [place]  [address]    Report Status 04/21/2020 FINAL  Final  SARS CORONAVIRUS 2 (TAT 6-24 HRS)     Status: None   Collection Time: 04/29/20  3:11 PM  Result Value Ref Range Status   SARS Coronavirus 2 NEGATIVE NEGATIVE Final    Comment: (NOTE) SARS-CoV-2 target nucleic acids are NOT DETECTED.  The SARS-CoV-2 RNA is generally detectable in upper and lower respiratory specimens during the acute phase of infection. Negative results do not preclude SARS-CoV-2 infection, do not rule out co-infections with other pathogens, and should not be used as the sole basis for treatment or other patient management decisions. Negative results must be combined with clinical observations, patient history, and epidemiological information. The expected result is Negative.  Fact Sheet for Patients: SugarRoll.be  Fact Sheet for Healthcare Providers: https://www.woods-mathews.com/  This test is not yet approved or cleared by the [place] FDA and  has been authorized for detection and/or diagnosis of SARS-CoV-2 by FDA under an Emergency Use Authorization (EUA). This EUA will remain  in effect (meaning this test can be used) for the duration of the COVID-19 declaration under Se ction 564(b)(1) of the Act, 21 U.S.C. section 360bbb-3(b)(1), unless the authorization is terminated or revoked sooner.  Performed at [facility], [place] [address] [place], [place] [address]     Coagulation Studies: No results for input(s): LABPROT, INR in the last 72 hours.  Urinalysis: No results for input(s): COLORURINE, LABSPEC, PHURINE, GLUCOSEU, HGBUR, BILIRUBINUR, KETONESUR, PROTEINUR, UROBILINOGEN, NITRITE, LEUKOCYTESUR in the last 72 hours.  Invalid input(s): APPERANCEUR    Imaging: CT PELVIS WO CONTRAST  Result Date: 05/02/2020 CLINICAL DATA:  Ulcer with foul odor and low-grade fever. EXAM: CT PELVIS WITHOUT CONTRAST TECHNIQUE: Multidetector CT imaging of the pelvis was  performed following the standard protocol without intravenous contrast. COMPARISON:  None. FINDINGS: Urinary Tract: Foley catheter decompresses the urinary bladder. No bladder wall thickening. Bowel: Pelvic bowel loops are noninflamed. Moderate stool in the sigmoid colon. Vascular/Lymphatic: Aortic aneurysm with aorto bi-iliac stent graft in place. Maximal aneurysm sac diameter 5.3 cm. No priors for comparison. No periaortic stranding. There is aortic and iliac atherosclerosis. Dense calcification in the region of the proximal right common femoral artery. Presumed vascular coils in the region of the left internal iliac artery. An IVC filter is partially included. Reproductive:  Enlarged prostate gland spanning 5.5 cm transverse. Other: Fat in both inguinal canals. No pelvic free fluid or fluid collection. Musculoskeletal: Skin and soft tissue defect in the sacrococcygeal region with ill-defined stranding extending towards the coccyx. Soft tissue changes  extend to the coccygeal cortex posteriorly in the left aspect. No evidence of adjacent periosteal reaction or bony destruction. No focal fluid collection. Heterotopic calcification posterior to the left hip in the gluteus medius muscle. There is degenerative change including the lower lumbar spine. IMPRESSION: 1. Skin and soft tissue defect consistent with decubitus ulcer in the sacrococcygeal region with ill-defined stranding extending to the coccyx. No evidence of adjacent periosteal reaction or bony destruction. No focal fluid collection. 2. Enlarged prostate gland. 3. Post aortic stent graft of the abdominal aortic aneurysm, residual aneurysm sac measuring 5.3 cm. No prior exams available for comparison. Aortic Atherosclerosis (ICD10-I70.0). Electronically Signed   By: Narda Rutherford M.D.   On: 05/02/2020 16:53     Medications:     iohexol  Assessment/ Plan:  74 y.o. male with a PMHx of abdominal aortic aneurysm, right hepatic lobe lesion, cystic  mass left kidney, BPH, history of tobacco abuse, hypertension, hyperlipidemia, COVID-19 infection, acute respiratory failure secondary to COVID-19 infection and Pseudomonas who was admitted to [facility] on 03/02/2020 for ongoing management.  1.  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  Suspect that the patient has developed acute tubular necrosis.  He was also exposed to contrast on 03/24/2020.  -Overall renal function has improved.  Creatinine currently 2.1.  BUN still a bit high at 75.  Continue to monitor renal parameters and avoid nephrotoxins as possible.  2.  Anemia of chronic kidney disease.  Hemoglobin up to 9.0.  Continue to monitor blood count.  3.  Hypernatremia.  Patient continues to intermittently required D5W.  Most recent serum sodium was 150.  Continue to monitor.   LOS: 0 Cordney Barstow 6/14/20218:17 AM

## 2020-06-11 ENCOUNTER — Telehealth: Payer: Self-pay | Admitting: *Deleted

## 2020-06-11 ENCOUNTER — Other Ambulatory Visit: Payer: Self-pay | Admitting: Interventional Radiology

## 2020-06-11 DIAGNOSIS — Z86711 Personal history of pulmonary embolism: Secondary | ICD-10-CM

## 2020-06-11 NOTE — Telephone Encounter (Signed)
Follow up call to evaluate IVC Filter placed by Dr. Archer Asa on 03-25-2020. I s/w pt wife and she states Mr. Russell Rios is not mobile, not able to walk. He is getting physical therapy and she hopes to have him back walking by Oct. She asked if I will call back in October. Per wife he is currently on Eliquis. I told her I will call back in Oct./vm

## 2020-07-11 IMAGING — DX DG CHEST 1V PORT
1 series · 1 of 1 positions shown · non-contrast
Comparison: 04/04/2020.  03/02/2020.

CLINICAL DATA: Central line placement.

EXAM:
PORTABLE CHEST 1 VIEW

[chest]
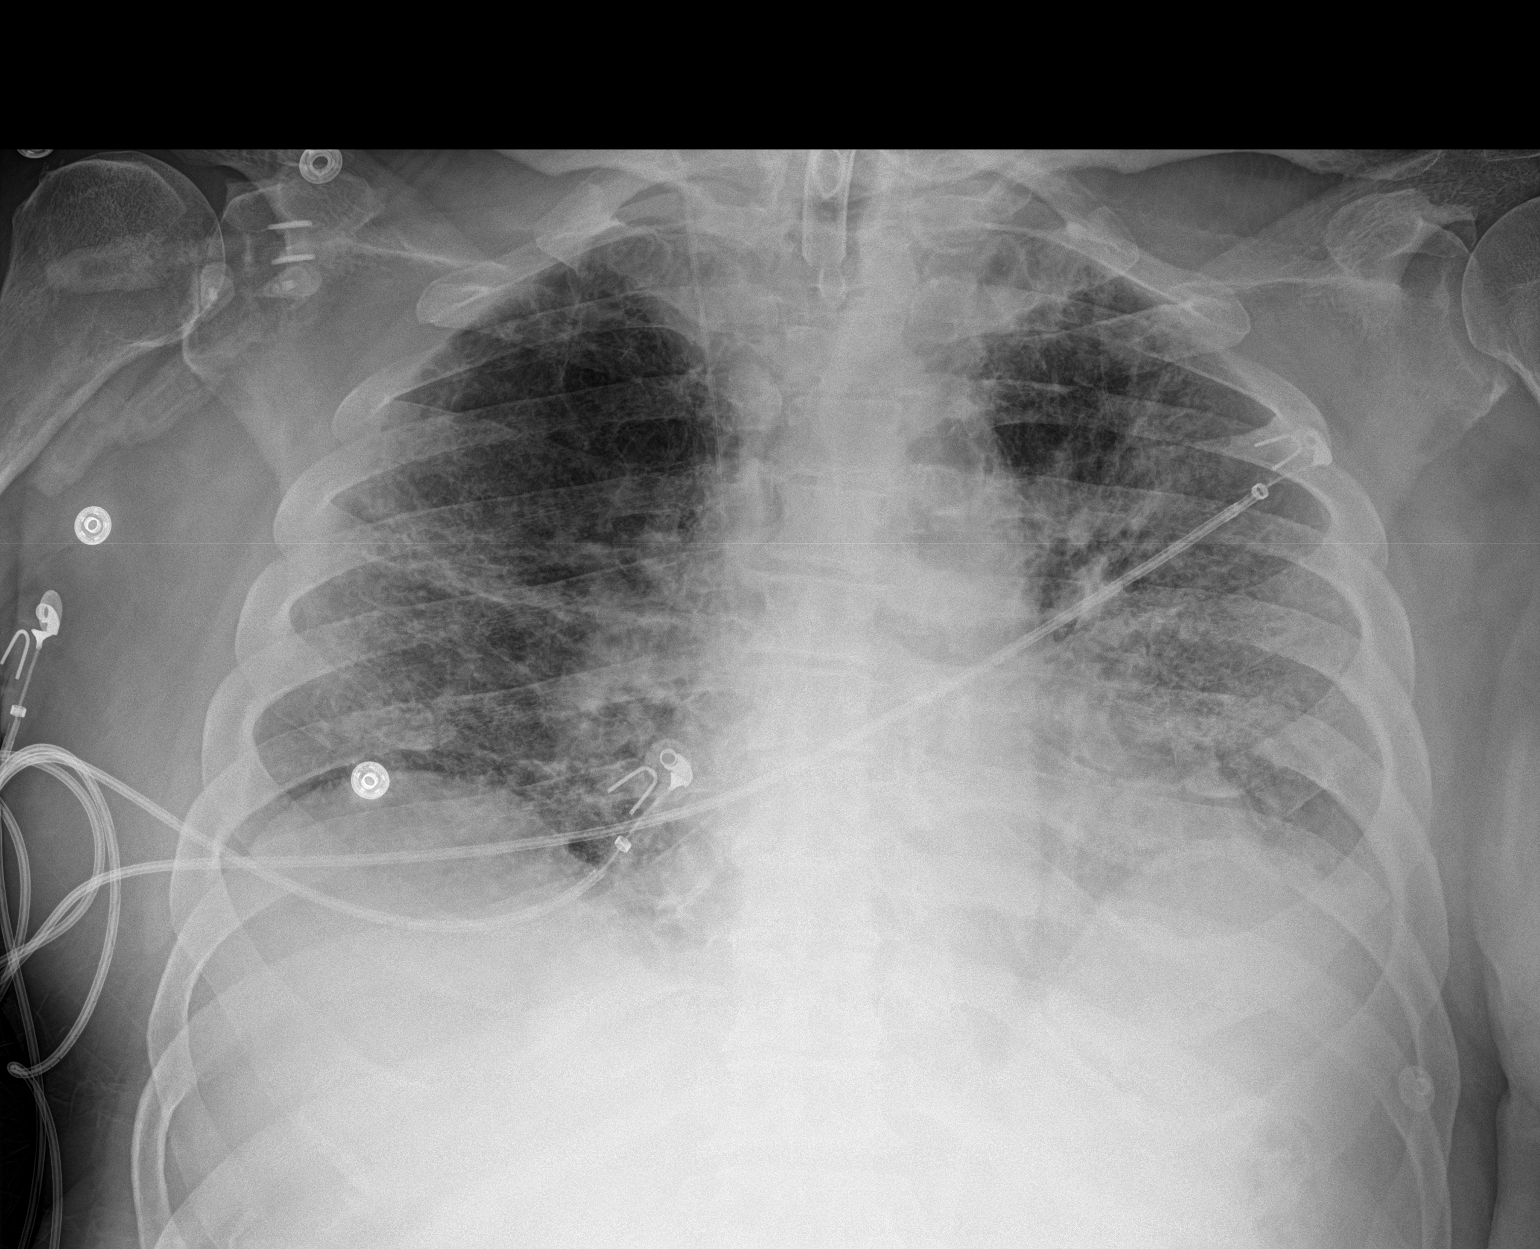

[1 of 1 positions shown; findings below may reference images not displayed]

FINDINGS: Tracheostomy tube noted in stable position. Right IJ line noted with
tip over cavoatrial junction. Heart size normal. Bilateral
interstitial markings again noted without interim change. Findings
consistent chronic interstitial lung disease. No acute alveolar
infiltrate. No pleural effusion or pneumothorax. Heart size stable.
Degenerative change thoracic spine.
IMPRESSION: 1. Tracheostomy tube stable position. Right IJ line noted with tip
over cavoatrial junction. No evidence of pneumothorax.

2.  Chronic interstitial lung disease.  No interim change.

## 2020-07-27 IMAGING — DX DG CHEST 1V PORT
1 series · 1 of 1 positions shown · non-contrast
Comparison: 04/09/2020

CLINICAL DATA: Respiratory failure.

EXAM:
PORTABLE CHEST 1 VIEW

[chest]
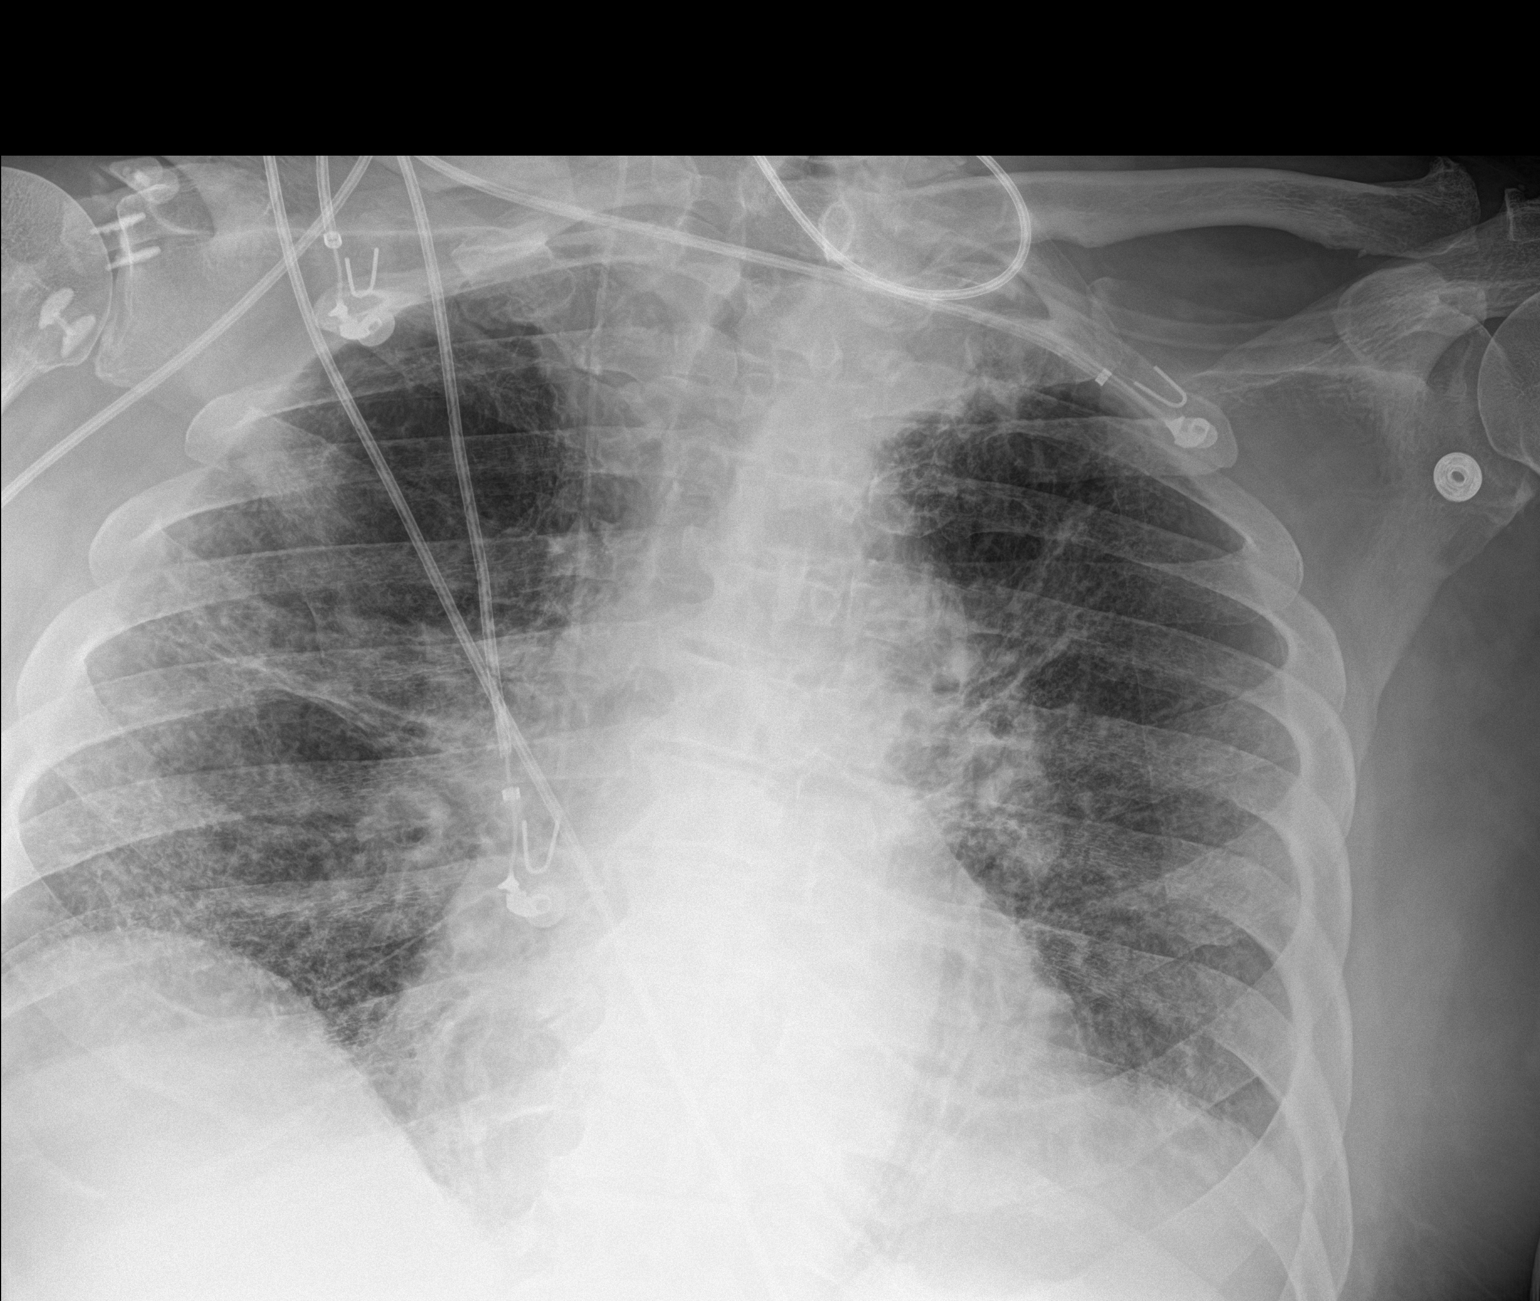

[1 of 1 positions shown; findings below may reference images not displayed]

FINDINGS: Tracheostomy tube has been removed. RIGHT IJ central line tip
overlies the superior vena cava.

Heart is mildly enlarged but stable in configuration. Better lung
inflation. There is persistence of fine reticular opacities
throughout the lungs bilaterally without new consolidation. There is
stable consolidation at the lung apices, LEFT greater than RIGHT.
IMPRESSION: 1. Better lung inflation.
2. Persistent interstitial infiltrates.

## 2020-08-24 ENCOUNTER — Telehealth: Payer: Self-pay

## 2020-08-24 NOTE — Telephone Encounter (Signed)
S/w Pt and his wife Russell Rios about scheduling f/u appt, they have asked to be contacted back next month to schedule.

## 2020-09-17 ENCOUNTER — Telehealth: Payer: Self-pay

## 2020-09-17 NOTE — Telephone Encounter (Signed)
S/W pt and his wife Hilda Lias they stated that the pt has so many upcoming appts and have requested a callback in the sping time to schedule this appt.

## 2021-06-08 ENCOUNTER — Telehealth: Payer: Self-pay

## 2021-06-08 NOTE — Telephone Encounter (Signed)
S/W Mrs. Marcy stated that pt is having prostate surgery on 06/30/21 and would like to hold off on scheduling appt until recovered from this procedure.

## 2021-08-02 ENCOUNTER — Other Ambulatory Visit: Payer: Self-pay | Admitting: Interventional Radiology

## 2021-08-02 DIAGNOSIS — Z86711 Personal history of pulmonary embolism: Secondary | ICD-10-CM

## 2021-10-19 ENCOUNTER — Ambulatory Visit
Admission: RE | Admit: 2021-10-19 | Discharge: 2021-10-19 | Disposition: A | Discharge status: Home / Self Care | Payer: Medicare Other | Source: Ambulatory Visit | Attending: Interventional Radiology | Admitting: Interventional Radiology

## 2021-10-19 ENCOUNTER — Encounter: Payer: Self-pay | Admitting: *Deleted

## 2021-10-19 DIAGNOSIS — Z86711 Personal history of pulmonary embolism: Secondary | ICD-10-CM

## 2021-10-19 HISTORY — PX: IR RADIOLOGIST EVAL & MGMT: IMG5224

## 2021-10-19 NOTE — Progress Notes (Signed)
Chief Complaint: Patient was seen in consultation today for IVC filter in place at the request of Antoneo Ghrist K  Referring Physician(s): Jourden Gilson K  History of Present Illness: Russell Rios is a 75 y.o. male prior history of COVID-19 pneumonia resulting in respiratory failure and tracheostomy placement. He recently had an episode of hemoptysis and CT imaging identified multifocal small pulmonary emboli. Unfortunately, he also has a recent history of GI bleeding and is therefore not an optimal candidate for anticoagulation.  He underwent placement of a potentially retrievable IVC filter on 03/25/2020.  He has since recovered and has resumed anticoagulation.  He is on Coumadin and tolerating it well other than the fact that he often feels very cold and is quite temperature sensitive.  He denies lower extremity swelling.  He continues to have chronic respiratory failure and is on home oxygen.  Also, he is wheelchair-bound and ambulates in limited fashion due to his poor pulmonary reserves.  Past Medical History:  Diagnosis Date   Acute on chronic respiratory failure with hypoxia (HCC)    Chronic atrial fibrillation with rapid ventricular response (HCC)    Chronic kidney disease, stage III (moderate) (HCC)    COVID-19 virus infection    Healthcare-associated pneumonia     Past Surgical History:  Procedure Laterality Date   IR IVC FILTER PLMT / S&I /IMG GUID/MOD SED  03/25/2020   IR RADIOLOGIST EVAL & MGMT  10/19/2021    Allergies: Atorvastatin, Lisinopril, and Simvastatin  Medications: Prior to Admission medications   Not on File     No family history on file.  Social History   Socioeconomic History   Marital status: Married    Spouse name: Not on file   Number of children: Not on file   Years of education: Not on file   Highest education level: Not on file  Occupational History   Not on file  Tobacco Use   Smoking status: Not on file   Smokeless  tobacco: Not on file  Substance and Sexual Activity   Alcohol use: Not on file   Drug use: Not on file   Sexual activity: Not on file  Other Topics Concern   Not on file  Social History Narrative   Not on file   Social Determinants of Health   Financial Resource Strain: Not on file  Food Insecurity: Not on file  Transportation Needs: Not on file  Physical Activity: Not on file  Stress: Not on file  Social Connections: Not on file    Review of Systems: A 12 point ROS discussed and pertinent positives are indicated in the HPI above.  All other systems are negative.  Review of Systems  Vital Signs: There were no vitals taken for this visit.  Physical Exam Constitutional:      Appearance: Normal appearance.  HENT:     Head: Normocephalic and atraumatic.  Eyes:     General: No scleral icterus. Cardiovascular:     Rate and Rhythm: Normal rate.  Pulmonary:     Effort: Pulmonary effort is normal.  Abdominal:     General: Abdomen is flat.     Palpations: Abdomen is soft.  Musculoskeletal:        General: No swelling.  Skin:    General: Skin is warm and dry.  Neurological:     Mental Status: He is alert and oriented to person, place, and time.  Psychiatric:        Mood and Affect: Mood normal.  Behavior: Behavior normal.      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 He has an IVC filter in place. Evaluate for residual lower extremity DVT. EXAM: BILATERAL LOWER EXTREMITY VENOUS DOPPLER ULTRASOUND TECHNIQUE: Gray-scale sonography with graded compression, as well as color Doppler and duplex ultrasound were performed to evaluate the lower extremity deep venous systems from the level of the common femoral vein and including the common femoral, femoral, profunda femoral, popliteal and calf veins including the posterior tibial, peroneal and gastrocnemius veins when visible.  The superficial great saphenous vein was also interrogated. Spectral Doppler was utilized to evaluate flow at rest and with distal augmentation maneuvers in the common femoral, femoral and popliteal veins. COMPARISON:  None. FINDINGS: RIGHT LOWER EXTREMITY Common Femoral Vein: No evidence of thrombus. Normal compressibility, respiratory phasicity and response to augmentation. Saphenofemoral Junction: No evidence of thrombus. Normal compressibility and flow on color Doppler imaging. Profunda Femoral Vein: No evidence of thrombus. Normal compressibility and flow on color Doppler imaging. Femoral Vein: No evidence of thrombus. Normal compressibility, respiratory phasicity and response to augmentation. Popliteal Vein: No evidence of thrombus. Normal compressibility, respiratory phasicity and response to augmentation. Calf Veins: No evidence of thrombus. Normal compressibility and flow on color Doppler imaging. Superficial Great Saphenous Vein: No evidence of thrombus. Normal compressibility. Venous Reflux:  None. Other Findings:  None. LEFT LOWER EXTREMITY Common Femoral Vein: Mild thickening of the posterior wall of the common femoral vein consistent with sequelae of remote prior DVT. No evidence of acute DVT. Saphenofemoral Junction: No evidence of thrombus. Normal compressibility and flow on color Doppler imaging. Profunda Femoral Vein: No evidence of thrombus. Normal compressibility and flow on color Doppler imaging. Femoral Vein: No evidence of thrombus. Normal compressibility, respiratory phasicity and response to augmentation. Popliteal Vein: No evidence of thrombus. Normal compressibility, respiratory phasicity and response to augmentation. Calf Veins: No evidence of thrombus. Normal compressibility and flow on color Doppler imaging. Superficial Great Saphenous Vein: No evidence of thrombus. Normal compressibility. Venous Reflux:  None. Other Findings:  None. IMPRESSION: No evidence of deep venous thrombosis in  either lower extremity. Minimal sequelae of remote prior DVT with thickening of the wall of the left common femoral vein. Electronically Signed   By: Malachy Moan M.D.   On: 10/19/2021 16:10   IR Radiologist Eval & Mgmt  Result Date: 10/19/2021 Please refer to notes tab for details about interventional procedure. (Op Note)   Labs:  CBC: No results for input(s): WBC, HGB, HCT, PLT in the last 8760 hours.  COAGS: No results for input(s): INR, APTT in the last 8760 hours.  BMP: No results for input(s): NA, K, CL, CO2, GLUCOSE, BUN, CALCIUM, CREATININE, GFRNONAA, GFRAA in the last 8760 hours.  Invalid input(s): CMP  LIVER FUNCTION TESTS: No results for input(s): BILITOT, AST, ALT, ALKPHOS, PROT, ALBUMIN in the last 8760 hours.  TUMOR MARKERS: No results for input(s): AFPTM, CEA, CA199, CHROMGRNA in the last 8760 hours.  Assessment and Plan:  Very pleasant 75 year old gentleman with an IVC filter in place.  Although he is back on Coumadin and tolerating it well, he is extremely reluctant to have the IVC filter retrieved.  I had a long discussion with he and his wife about the utility of the IVC filter, the potential complications and the pros and cons of retrieving it versus leaving it in place.  Additionally, we had a long conversation about  the differences between provoked DVT and unprovoked DVT and when the patient may be able to come off anticoagulation therapy.  As far as we can both tell, his DVT was provoked and due to his COVID-pneumonia, hospitalization and immobility.  Given the absence of any residual DVT in his lower extremities on today's duplex ultrasound, I think he could be a candidate to come off his Coumadin therapy but I did advise him to discuss this with his primary care physician who is prescribing this medication for him.  After all of this discussion, he has decided that he wants to keep the IVC filter and not have it retrieved.  He understands that I will  now declare this a permanent device and we will no longer follow-up with him.  I did let him know that if he ever changes his mind and wants to revisit the situation for him to call my office and I will gladly see him at any time.    Electronically Signed: Sterling Big 10/19/2021, 4:37 PM   I spent a total of  25 Minutes in face to face in clinical consultation, greater than 50% of which was counseling/coordinating care for IVC filter in place.

## 2023-03-21 DEATH — deceased
